# Patient Record
Sex: Female | Born: 1995 | Race: White | Hispanic: No | Marital: Married | Smoking: Never smoker
Health system: Southern US, Community
[De-identification: ages and names within clinical notes are randomized; demographics above are authoritative.]

## PROBLEM LIST (undated history)

## (undated) DIAGNOSIS — F410 Panic disorder [episodic paroxysmal anxiety] without agoraphobia: Secondary | ICD-10-CM

## (undated) DIAGNOSIS — N6001 Solitary cyst of right breast: Secondary | ICD-10-CM

## (undated) DIAGNOSIS — F419 Anxiety disorder, unspecified: Secondary | ICD-10-CM

## (undated) DIAGNOSIS — F32A Depression, unspecified: Secondary | ICD-10-CM

## (undated) HISTORY — DX: Anxiety disorder, unspecified: F41.9

## (undated) HISTORY — DX: Solitary cyst of right breast: N60.01

## (undated) HISTORY — DX: Depression, unspecified: F32.A

## (undated) HISTORY — DX: Panic disorder (episodic paroxysmal anxiety): F41.0

## (undated) HISTORY — PX: WISDOM TOOTH EXTRACTION: SHX21

---
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
## 2016-03-05 ENCOUNTER — Ambulatory Visit (INDEPENDENT_AMBULATORY_CARE_PROVIDER_SITE_OTHER): Payer: BLUE CROSS/BLUE SHIELD | Admitting: Nurse Practitioner

## 2016-03-05 ENCOUNTER — Encounter: Payer: Self-pay | Admitting: Nurse Practitioner

## 2016-03-05 VITALS — BP 124/80 | Ht 64.0 in | Wt 133.5 lb

## 2016-03-05 DIAGNOSIS — B3731 Acute candidiasis of vulva and vagina: Secondary | ICD-10-CM

## 2016-03-05 DIAGNOSIS — B373 Candidiasis of vulva and vagina: Secondary | ICD-10-CM

## 2016-03-05 MED ORDER — FLUCONAZOLE 150 MG PO TABS: ORAL_TABLET | ORAL | Status: DC

## 2016-03-05 NOTE — Patient Instructions (Addendum)
Acidophilus supplement

## 2016-03-06 ENCOUNTER — Encounter: Payer: Self-pay | Admitting: Nurse Practitioner

## 2016-03-06 NOTE — Progress Notes (Signed)
Subjective:  Presents for c/o vaginal yeast infection. Slight itching and burning in the vaginal area with white clumpy discharge. Was seen by another provider and prescribed diflucan; took first dose on 6/11. Was slightly better but symptoms came back. No pelvic exam today. Has started her cycle. Works as a Public relations account executivelifeguard. Began after wearing damp swim suit. Same sexual partner. No fever or pelvic pain. Third yeast infection over the past year; tends to have them every 4 months or so.    Objective:   BP 124/80 mmHg  Ht 5\' 4"  (1.626 m)  Wt 133 lb 8 oz (60.555 kg)  BMI 22.90 kg/m2 NAD. Alert, oriented. Lungs clear. Heart RRR.    Assessment: Vaginal candidiasis  Plan:  Meds ordered this encounter  Medications  . fluconazole (DIFLUCAN) 150 MG tablet    Sig: One po qd prn yeast infection; may repeat in 3-4 days if needed    Dispense:  2 tablet    Refill:  0    Order Specific Question:  Supervising Provider    Answer:  Merlyn AlbertLUKING, WILLIAM S [[id number]]   Discussed causes of yeast infections and preventive measures. Take next dose of Diflucan tomorrow as planned. Call back by Friday if persists.  Given another Rx in case it is needed over the summer.

## 2016-03-19 ENCOUNTER — Telehealth: Payer: Self-pay | Admitting: Nurse Practitioner

## 2016-03-19 NOTE — Telephone Encounter (Signed)
I would recommend repeating the Diflucan again in one week's time. Patient may use over-the-counter in addition to this such as Monistat-if ongoing troubles reconnect with Stacey Jonesarolyn

## 2016-03-19 NOTE — Telephone Encounter (Signed)
Patient was seen for a yeast infection on 03/05/2016, given Diflucan, and Eber JonesCarolyn told patient to call us back if it came back.  She is having the same symptoms again. She has two diflucan pills, has taken one today.  She said Eber JonesCarolyn had mentioned trying her on something else if it came back.  Please advise.

## 2016-03-19 NOTE — Telephone Encounter (Signed)
[facility] 03/19/16

## 2016-03-20 NOTE — Telephone Encounter (Signed)
[facility] 03/20/16 

## 2016-03-21 NOTE — Telephone Encounter (Signed)
Spoke with patient and informed her per Dr.Scott Luking- Dr.Scott would recommend repeating the Diflucan again in one week's time. Patient may use over-the-counter in addition to this such as Monistat-if ongoing troubles reconnect with Eber Jonesarolyn. Patient verbalized understanding.

## 2016-03-22 ENCOUNTER — Telehealth: Payer: Self-pay | Admitting: Family Medicine

## 2016-03-22 MED ORDER — FLUCONAZOLE 150 MG PO TABS: ORAL_TABLET | ORAL | Status: DC

## 2016-03-22 NOTE — Telephone Encounter (Signed)
Patient was told by Dr. Lorin PicketScott to call back today and have 2 diflucan called in for her yeast infection.    [facility]  Also, we did a prior authorization for patient's norethindrone-ethinyl estradiol 1/35 (ORTHO-NOVUM, NORTREL,CYCLAFEM) tablet in March 2017, but mom said that nothing had ever changed with the price and the pharmacy is still telling her we just have to call the insurance and tell them why she needs it.  She would like a nurse to call her back regarding this.

## 2016-03-22 NOTE — Telephone Encounter (Signed)
Mom says to please call Adelina MingsKelsey at [phone].

## 2016-03-22 NOTE — Telephone Encounter (Signed)
Patients mother came by and signed the DPR form giving us permission to talk with her mother, Victorino DikeJennifer.  She is requesting a nurse to call her back, because the pharmacy is still telling her that a PA needs to be done.

## 2016-03-22 NOTE — Telephone Encounter (Signed)
Reviewed patient's chart and saw that prescription was approved in March for 1 year. Also called pharmacy and pharmacist stated that medication is covered 100% and patient picked up prescription on June 15 and paid $0.00 Copay. Spoke with patients mother and patient's mother stated that she has paid a copay. Informed mom that we will call pharmacy and see if they can fax a form where medication is covered.

## 2016-03-22 NOTE — Telephone Encounter (Signed)
Left message for patient to return call 03/22/16

## 2016-03-22 NOTE — Telephone Encounter (Signed)
Unable to speak with patient's mother due to patient's mother not being on HIPPA

## 2016-03-22 NOTE — Telephone Encounter (Signed)
Spoke with patient and informed her per Dr.Scott Luking- refills on Diflucan were sent into pharmacy. Also informed patient that Birth control Norethindrone-ethinyl esradiol 1/35 was approved in March via patient's insurance. Patient verbalized understanding.

## 2016-03-25 NOTE — Telephone Encounter (Signed)
Spoke with Trey PaulaJeff at [facility]- approval is on file and good 12/15/15-11/2416. Insurance stated it is a Production designer, theatre/television/film9.88 dollar copay. Confirmed with pharmacist Tripp at [facility] that the med is going thru correctly and notified mother who verbalized understanding.

## 2016-03-25 NOTE — Telephone Encounter (Signed)
Left message to return call 

## 2016-05-17 ENCOUNTER — Telehealth: Payer: Self-pay | Admitting: Nurse Practitioner

## 2016-05-17 MED ORDER — FLUCONAZOLE 150 MG PO TABS: ORAL_TABLET | ORAL | 0 refills | Status: DC

## 2016-05-17 NOTE — Telephone Encounter (Signed)
Spoke with patient to discuss symptoms. Patient has c/o vaginal itching, and burning with white discharge. Per protocol Diflucan 150 MG with two tablets, take 1 tablet 3-4 days apart was sent into patient's requested pharmacy. Patient verbalized understanding.

## 2016-05-17 NOTE — Telephone Encounter (Signed)
Pt is needing two diflucan called in for a yeast infection.      [facility] [address] [place]

## 2016-08-09 ENCOUNTER — Telehealth: Payer: Self-pay | Admitting: Family Medicine

## 2016-08-09 MED ORDER — FLUCONAZOLE 150 MG PO TABS: ORAL_TABLET | ORAL | 0 refills | Status: DC

## 2016-08-09 NOTE — Telephone Encounter (Signed)
Pt is requesting diflucan x2 to be called in for a yeast inf.    [facility] [place] [address]

## 2016-08-09 NOTE — Telephone Encounter (Signed)
Spoke with patient to discuss symptoms and patient stated that she is experiencing white vaginal discharge with vaginal itching, and swelling. Per protocol Diflucan 150 mg was sent into pharmacy.

## 2016-08-09 NOTE — Telephone Encounter (Signed)
Tried to call no answer. (voicemail box full) 08/09/16

## 2016-08-13 ENCOUNTER — Telehealth: Payer: Self-pay | Admitting: Family Medicine

## 2016-08-13 ENCOUNTER — Other Ambulatory Visit: Payer: Self-pay | Admitting: Nurse Practitioner

## 2016-08-13 MED ORDER — TERCONAZOLE 0.4 % VA CREA: 1.0000 | TOPICAL_CREAM | Freq: Every day | VAGINAL | 0 refills | Status: DC

## 2016-08-13 NOTE — Telephone Encounter (Signed)
Pt called stating that she took the last diflucan yesterday and that there is still some itching that all the other symptoms have went away. Please advise.

## 2016-08-13 NOTE — Telephone Encounter (Signed)
Will send in a strong vaginal cream to use nightly for 7 days. Also start OTC acidophilus to help prevent yeast infection. Call back if persists.

## 2016-08-13 NOTE — Telephone Encounter (Signed)
Patient has already scheduled a recheck tomorrow with you concerning this issue

## 2016-08-14 ENCOUNTER — Ambulatory Visit (INDEPENDENT_AMBULATORY_CARE_PROVIDER_SITE_OTHER): Payer: BLUE CROSS/BLUE SHIELD | Admitting: Nurse Practitioner

## 2016-08-14 ENCOUNTER — Encounter: Payer: Self-pay | Admitting: Nurse Practitioner

## 2016-08-14 VITALS — BP 130/88 | Ht 64.0 in | Wt 141.8 lb

## 2016-08-14 DIAGNOSIS — B373 Candidiasis of vulva and vagina: Secondary | ICD-10-CM | POA: Diagnosis not present

## 2016-08-14 DIAGNOSIS — Z23 Encounter for immunization: Secondary | ICD-10-CM | POA: Diagnosis not present

## 2016-08-14 DIAGNOSIS — Z113 Encounter for screening for infections with a predominantly sexual mode of transmission: Secondary | ICD-10-CM

## 2016-08-14 DIAGNOSIS — B3731 Acute candidiasis of vulva and vagina: Secondary | ICD-10-CM

## 2016-08-14 NOTE — Telephone Encounter (Signed)
Office visit done 08/14/16

## 2016-08-16 ENCOUNTER — Encounter: Payer: Self-pay | Admitting: Nurse Practitioner

## 2016-08-16 NOTE — Progress Notes (Signed)
Subjective:  Presents for complaints of recurrent vaginal candidiasis. First diagnosed with this a few months ago. Has been with the same sexual partner for over a year. Diflucan usually works but the second dose this past week did not help. Worse with stress. No missed birth control pills. Regular menses, normal flow. White thick discharge. No pelvic pain fever or rash. No pain with intercourse. No odor or itching. Patient's phone message was answered before visit but she was not aware of this. Taking daily acidophilus which has cut back on the number and severity of her yeast infections.  Objective:   BP 130/88   Ht 5\' 4"  (1.626 m)   Wt 141 lb 12.8 oz (64.3 kg)   BMI 24.34 kg/m  NAD. Alert, oriented. Lungs clear. Heart regular rhythm. Abdomen soft nontender.  Assessment: Vaginal candidiasis - Plan: Chlamydia/Gonococcus/Trichomonas, NAA  Screen for STD (sexually transmitted disease) - Plan: Chlamydia/Gonococcus/Trichomonas, NAA  Need for influenza vaccination - Plan: Flu Vaccine QUAD 36+ mos IM  Encounter for immunization - Plan: Flu Vaccine QUAD 36+ mos IM  Plan: Start Terazol cream as directed. Discussed importance of stress reduction. Continue acidophilus as directed. Patient to call back if symptoms worsen or persist.

## 2016-08-18 LAB — CHLAMYDIA/GONOCOCCUS/TRICHOMONAS, NAA
CHLAMYDIA BY NAA: NEGATIVE
GONOCOCCUS BY NAA: NEGATIVE
TRICH VAG BY NAA: NEGATIVE

## 2016-10-11 ENCOUNTER — Ambulatory Visit (INDEPENDENT_AMBULATORY_CARE_PROVIDER_SITE_OTHER): Payer: BLUE CROSS/BLUE SHIELD | Admitting: Nurse Practitioner

## 2016-10-11 ENCOUNTER — Encounter: Payer: Self-pay | Admitting: Nurse Practitioner

## 2016-10-11 VITALS — BP 100/68 | Ht 64.0 in | Wt 139.0 lb

## 2016-10-11 DIAGNOSIS — Z3041 Encounter for surveillance of contraceptive pills: Secondary | ICD-10-CM | POA: Diagnosis not present

## 2016-10-11 MED ORDER — NORETHINDRONE-ETH ESTRADIOL 1-35 MG-MCG PO TABS: 1.0000 | ORAL_TABLET | Freq: Every day | ORAL | 11 refills | Status: DC

## 2016-10-11 NOTE — Progress Notes (Signed)
Subjective:  Presents for recheck on birth control. No BTB. No missed pills. Same sexual partner. Rare smoker. STD testing UTD.  Objective:   BP 100/68   Ht 5\' 4"  (1.626 m)   Wt 139 lb (63 kg)   BMI 23.86 kg/m  NAD. Alert, oriented. Lungs clear. Heart RRR.   Assessment:  Encounter for surveillance of contraceptive pills    Plan:  Meds ordered this encounter  Medications  . norethindrone-ethinyl estradiol 1/35 (ORTHO-NOVUM, NORTREL,CYCLAFEM) tablet    Sig: Take 1 tablet by mouth daily.    Dispense:  1 Package    Refill:  11    Order Specific Question:   Supervising Provider    Answer:   Merlyn AlbertLUKING, WILLIAM S [[id number]]   Continue current oc. Recommend preventive health physical next year after she turns 22.  Call back sooner if any problems.

## 2016-10-21 ENCOUNTER — Other Ambulatory Visit: Payer: Self-pay | Admitting: *Deleted

## 2016-10-21 ENCOUNTER — Telehealth: Payer: Self-pay | Admitting: Family Medicine

## 2016-10-21 MED ORDER — FLUCONAZOLE 150 MG PO TABS: ORAL_TABLET | ORAL | 0 refills | Status: DC

## 2016-10-21 NOTE — Telephone Encounter (Signed)
Pt has a yeast infection, [facility]  Please call pt when done

## 2016-10-21 NOTE — Telephone Encounter (Signed)
Vaginal itching and white discharge. Diflucan 150mg  #2 one 3 days apart per dr Lorin Picketscott. Med sent to pharm. Pt notified med sent and to follow up in office if symptoms do not clear up with diflucan.

## 2017-09-03 ENCOUNTER — Other Ambulatory Visit: Payer: Self-pay | Admitting: Nurse Practitioner

## 2017-09-17 ENCOUNTER — Telehealth: Payer: Self-pay | Admitting: Nurse Practitioner

## 2017-09-17 MED ORDER — NORETHINDRONE-ETH ESTRADIOL 1-35 MG-MCG PO TABS: 1.0000 | ORAL_TABLET | Freq: Every day | ORAL | 0 refills | Status: DC

## 2017-09-17 NOTE — Telephone Encounter (Signed)
Patient is aware 

## 2017-09-17 NOTE — Telephone Encounter (Signed)
Pt is needing a refill on  ALAYCEN 1/35 tablet   [facility]  Pt has an appt scheduled for 09/29/2017

## 2017-09-17 NOTE — Telephone Encounter (Signed)
Ok 30 d 

## 2017-09-29 ENCOUNTER — Encounter: Payer: Self-pay | Admitting: Nurse Practitioner

## 2017-09-29 ENCOUNTER — Ambulatory Visit: Payer: BLUE CROSS/BLUE SHIELD | Admitting: Nurse Practitioner

## 2017-09-29 VITALS — BP 122/78 | Ht 64.5 in | Wt 135.0 lb

## 2017-09-29 DIAGNOSIS — Z23 Encounter for immunization: Secondary | ICD-10-CM

## 2017-09-29 DIAGNOSIS — Z3041 Encounter for surveillance of contraceptive pills: Secondary | ICD-10-CM | POA: Diagnosis not present

## 2017-09-29 MED ORDER — NORETHINDRONE-ETH ESTRADIOL 1-35 MG-MCG PO TABS: 1.0000 | ORAL_TABLET | Freq: Every day | ORAL | 11 refills | Status: DC

## 2017-09-29 NOTE — Progress Notes (Signed)
Subjective: Presents for refill on her birth control pills.  No missed pills.  No breakthrough bleeding.  Denies any adverse effects.  Currently in college, very active with regular exercise.  Non-smoker.  Has been with the same sexual partner for the past several months.  According to the patient she had a complete STD testing done by the health department in [place] back in the summer.  Request flu vaccine today.  Objective:   BP 122/78   Ht 5' 4.5" (1.638 m)   Wt 135 lb (61.2 kg)   BMI 22.81 kg/m  NAD.  Alert, oriented.  Lungs clear.  Heart regular rate and rhythm.  Assessment:  Encounter for surveillance of contraceptive pills  Need for vaccination - Plan: Flu Vaccine QUAD 6+ mos PF IM (Fluarix Quad PF)    Plan:   Meds ordered this encounter  Medications  . norethindrone-ethinyl estradiol 1/35 (ALAYCEN 1/35) tablet    Sig: Take 1 tablet by mouth daily.    Dispense:  30 tablet    Refill:  11    Order Specific Question:   Supervising Provider    Answer:   Merlyn AlbertLUKING, WILLIAM S [[id number]]   Continue current pill as directed.  Again reviewed potential risk associated with OC use.  Discussed safe sex issues.  Strongly recommend physical with Pap smear this year while she is out of school on break.  Otherwise recheck in 1 year.

## 2017-10-27 ENCOUNTER — Telehealth: Payer: Self-pay | Admitting: Nurse Practitioner

## 2017-10-27 ENCOUNTER — Other Ambulatory Visit: Payer: Self-pay | Admitting: Nurse Practitioner

## 2017-10-27 MED ORDER — NORETHINDRONE-ETH ESTRADIOL 1-35 MG-MCG PO TABS: 1.0000 | ORAL_TABLET | Freq: Every day | ORAL | 11 refills | Status: DC

## 2017-10-27 NOTE — Telephone Encounter (Signed)
Please verify that we have not changed her actual hormones. The hormones should be the same but different generics can cause some issues. We are seeing more brands of generic birth control pills from around the world. Technically, they are supposed to be the same but women have had problems such as breakthrough bleeding. Each pharmacy will have different brands they order mainly depending on cost. The best thing to do is find out which one she tolerates the best and ask her pharmacy to stock that brand for her.

## 2017-10-27 NOTE — Telephone Encounter (Signed)
Pt called stating that she started a new birth control and is now bleeding and wants to know why. Pt was seen on 09/29/2017 and was prescribed norethindrone-ethinyl estradiol 1/35 (ALAYCEN 1/35) tablet

## 2017-10-27 NOTE — Telephone Encounter (Signed)
Pt called back wants the Name brand Alyacen called in with one year refill to the pharmacy.

## 2017-10-27 NOTE — Telephone Encounter (Signed)
Patient states she started this new birthcontrol on the Sunday after her period. She states she did not have any bleeding the first week after starting,but the second week after starting she started bleeding (a normal bleed for her period it is lite in nature).She has not missed any pills. She states in the past she was taking Alyacen that she gets in [place] and Nortrel that she gets here when home.Please advise.Is this normal?

## 2017-10-27 NOTE — Telephone Encounter (Signed)
I spoke with the pt she says she would like for you to send in Stacey Prince 35 mg /28 tablet to [facility] [address]She will call them and let them know they will need to keep in stock for her. She also wants to know if she needs to finish the one she is currently on or start on this new one? Please advise.

## 2017-10-27 NOTE — Telephone Encounter (Signed)
Done

## 2018-03-20 ENCOUNTER — Encounter: Payer: Self-pay | Admitting: Nurse Practitioner

## 2018-03-20 ENCOUNTER — Ambulatory Visit: Payer: BLUE CROSS/BLUE SHIELD | Admitting: Nurse Practitioner

## 2018-03-20 VITALS — BP 128/80 | Temp 98.5°F | Ht 64.5 in | Wt 131.0 lb

## 2018-03-20 DIAGNOSIS — J300 Vasomotor rhinitis: Secondary | ICD-10-CM | POA: Diagnosis not present

## 2018-03-20 MED ORDER — METHYLPREDNISOLONE ACETATE 40 MG/ML IJ SUSP
40.0000 mg | Freq: Once | INTRAMUSCULAR | Status: AC
  Administered 2018-03-20: 40 mg via INTRAMUSCULAR

## 2018-03-20 NOTE — Progress Notes (Signed)
Subjective: Presents for complaints of sinus pressure and pain in her ears especially the left side over the past week.  No fever.  Sore throat improved.  No headache.  Occasional nonproductive cough.  No wheezing.  Objective:   BP 128/80   Temp 98.5 F (36.9 C) (Oral)   Ht 5' 4.5" (1.638 m)   Wt 131 lb (59.4 kg)   BMI 22.14 kg/m  NAD.  Alert, oriented.  TMs retracted bilateral, more on the left, no erythema.  Pharynx minimally injected with cloudy PND noted.  Neck supple with mild soft anterior adenopathy.  Lungs clear.  Heart regular rate and rhythm.  Assessment:  Vasomotor rhinitis - Plan: methylPREDNISolone acetate (DEPO-MEDROL) injection 40 mg    Plan:   Meds ordered this encounter  Medications  . methylPREDNISolone acetate (DEPO-MEDROL) injection 40 mg   Continue OTC loratadine as directed.  Add Nasacort AQ to regimen as directed.  Call back next week if no improvement, sooner if worse.  No antibiotics indicated at this time.  Given dose of Depo-Medrol due to intense ear pain and pressure.

## 2018-03-20 NOTE — Patient Instructions (Signed)
OTC antihistamine Nasacort AQ as directed 

## 2018-10-22 DIAGNOSIS — Z3040 Encounter for surveillance of contraceptives, unspecified: Secondary | ICD-10-CM | POA: Diagnosis not present

## 2018-10-22 DIAGNOSIS — Z01411 Encounter for gynecological examination (general) (routine) with abnormal findings: Secondary | ICD-10-CM | POA: Diagnosis not present

## 2018-10-22 DIAGNOSIS — N63 Unspecified lump in unspecified breast: Secondary | ICD-10-CM | POA: Diagnosis not present

## 2018-10-22 DIAGNOSIS — Z01419 Encounter for gynecological examination (general) (routine) without abnormal findings: Secondary | ICD-10-CM | POA: Diagnosis not present

## 2018-10-22 DIAGNOSIS — Z6823 Body mass index (BMI) 23.0-23.9, adult: Secondary | ICD-10-CM | POA: Diagnosis not present

## 2018-10-26 ENCOUNTER — Other Ambulatory Visit: Payer: Self-pay | Admitting: Obstetrics and Gynecology

## 2018-10-26 DIAGNOSIS — N6310 Unspecified lump in the right breast, unspecified quadrant: Secondary | ICD-10-CM

## 2018-10-30 ENCOUNTER — Ambulatory Visit
Admission: RE | Admit: 2018-10-30 | Discharge: 2018-10-30 | Disposition: A | Discharge status: Home / Self Care | Payer: BLUE CROSS/BLUE SHIELD | Source: Ambulatory Visit | Attending: Obstetrics and Gynecology | Admitting: Obstetrics and Gynecology

## 2018-10-30 ENCOUNTER — Other Ambulatory Visit: Payer: Self-pay | Admitting: Obstetrics and Gynecology

## 2018-10-30 DIAGNOSIS — N6310 Unspecified lump in the right breast, unspecified quadrant: Secondary | ICD-10-CM

## 2018-10-30 DIAGNOSIS — N6311 Unspecified lump in the right breast, upper outer quadrant: Secondary | ICD-10-CM | POA: Diagnosis not present

## 2018-11-02 ENCOUNTER — Other Ambulatory Visit: Payer: Self-pay | Admitting: Obstetrics and Gynecology

## 2018-12-05 DIAGNOSIS — J019 Acute sinusitis, unspecified: Secondary | ICD-10-CM | POA: Diagnosis not present

## 2019-01-13 ENCOUNTER — Other Ambulatory Visit: Payer: Self-pay

## 2019-01-13 ENCOUNTER — Ambulatory Visit (INDEPENDENT_AMBULATORY_CARE_PROVIDER_SITE_OTHER): Payer: BLUE CROSS/BLUE SHIELD | Admitting: Family Medicine

## 2019-01-13 ENCOUNTER — Encounter: Payer: Self-pay | Admitting: Family Medicine

## 2019-01-13 DIAGNOSIS — H6593 Unspecified nonsuppurative otitis media, bilateral: Secondary | ICD-10-CM

## 2019-01-13 MED ORDER — DOXYCYCLINE HYCLATE 100 MG PO TABS: 100.0000 mg | ORAL_TABLET | Freq: Two times a day (BID) | ORAL | 0 refills | Status: DC

## 2019-01-13 NOTE — Progress Notes (Signed)
   Subjective:    Patient ID: Stacey Prince, female    DOB: 03-05-1996, 23 y.o.   MRN: [id number]  HPI Patient calls with bilateral ear pain since weekend. Patient having some allergy sx but nothing related to ear pain.  Virtual Visit via Video Note  I connected with Stacey Prince on 01/13/19 at 11:30 AM EDT by a video enabled telemedicine application and verified that I am speaking with the correct person using two identifiers.   I discussed the limitations of evaluation and management by telemedicine and the availability of in person appointments. The patient expressed understanding and agreed to proceed.  History of Present Illness:    Observations/Objective:   Assessment and Plan:   Follow Up Instructions:    I discussed the assessment and treatment plan with the patient. The patient was provided an opportunity to ask questions and all were answered. The patient agreed with the plan and demonstrated an understanding of the instructions.   The patient was advised to call back or seek an in-person evaluation if the symptoms worsen or if the condition fails to improve as anticipated.  I provided 15 minutes of non-face-to-face time during this encounter.  Patient notes congestion.  Some drainage.  Allergy symptoms for a number of weeks.  Now fullness in both ears.  Bordering on pain.  Aching at times.  With some diminished hearing.    Review of Systems  No headache, no major weight loss or weight gain, no chest pain no back pain abdominal pain no change in bowel habits complete ROS otherwise negative     Objective:   Physical Exam  Virtual visit      Assessment & Plan:  Impression probable bilateral otitis media following respiratory allergies.  Discussed.  Symptom care discussed.  Antibiotics prescribed.  Follow-up if persists warning signs discussed

## 2019-05-04 ENCOUNTER — Other Ambulatory Visit: Payer: BLUE CROSS/BLUE SHIELD

## 2019-05-05 ENCOUNTER — Other Ambulatory Visit: Payer: Self-pay

## 2019-05-05 ENCOUNTER — Encounter: Payer: Self-pay | Admitting: Family Medicine

## 2019-05-05 ENCOUNTER — Ambulatory Visit (INDEPENDENT_AMBULATORY_CARE_PROVIDER_SITE_OTHER): Payer: BC Managed Care – PPO | Admitting: Family Medicine

## 2019-05-05 VITALS — BP 110/72 | Temp 97.7°F | Ht 64.5 in | Wt 143.8 lb

## 2019-05-05 DIAGNOSIS — Z0000 Encounter for general adult medical examination without abnormal findings: Secondary | ICD-10-CM

## 2019-05-05 NOTE — Progress Notes (Signed)
Shot record printed off from [place]. Does patient need this mailed to her? Please advise. Thank you

## 2019-05-05 NOTE — Progress Notes (Signed)
   Subjective:    Patient ID: Stacey Prince, female    DOB: [birth year], 23 y.o.   MRN: [id number]  HPI  The patient comes in today for a wellness visit. Patient denies being depressed.  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  A review of medications was also completed.  Any needed refills; none  Eating habits: eats good  Falls/  MVA accidents in past few months: none  Regular exercise: yes-active  Specialist pt sees on regular basis: none  Preventative health issues were discussed.   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  HENT: Negative for congestion and rhinorrhea.   Respiratory: Negative for cough and shortness of breath.   Cardiovascular: Negative for chest pain and leg swelling.  Gastrointestinal: Negative for abdominal pain and diarrhea.  Endocrine: Negative for polydipsia and polyphagia.  Skin: Negative for color change.  Neurological: Negative for dizziness and weakness.  Psychiatric/Behavioral: Negative for behavioral problems and confusion.        Objective:   Physical Exam Vitals signs reviewed.  Constitutional:      General: She is not in acute distress. HENT:     Head: Normocephalic and atraumatic.  Eyes:     General:        Right eye: No discharge.        Left eye: No discharge.  Neck:     Trachea: No tracheal deviation.  Cardiovascular:     Rate and Rhythm: Normal rate and regular rhythm.     Heart sounds: Normal heart sounds. No murmur.  Pulmonary:     Effort: Pulmonary effort is normal. No  respiratory distress.     Breath sounds: Normal breath sounds.  Lymphadenopathy:     Cervical: No cervical adenopathy.  Skin:    General: Skin is warm and dry.  Neurological:     Mental Status: She is alert.     Coordination: Coordination normal.  Psychiatric:        Behavior: Behavior normal.    Lab work not indicated  Patient gets female health checkups through her gynecologist     Assessment & Plan:  Adult wellness-complete.wellness physical was conducted today. Importance of diet and exercise were discussed in detail.  In addition to this a discussion regarding safety was also covered. We also reviewed over immunizations and gave recommendations regarding current immunization needed for age.  In addition to this additional areas were also touched on including: Preventative health exams needed:  Colonoscopy no colonoscopies necessary at  Patient was advised yearly wellness exam She will bring [place] her form so we can fill it out.  Patient should be able to do fine in her applied for a job

## 2019-05-06 NOTE — Progress Notes (Signed)
Mainly it was for me-I wanted to review it so I can make that determination if she needs more shots thank you

## 2019-05-06 NOTE — Progress Notes (Signed)
Shot record is in your office for review. Please advise. Thank you

## 2019-05-07 ENCOUNTER — Other Ambulatory Visit: Payer: Self-pay | Admitting: Obstetrics and Gynecology

## 2019-05-07 ENCOUNTER — Ambulatory Visit
Admission: RE | Admit: 2019-05-07 | Discharge: 2019-05-07 | Disposition: A | Discharge status: Home / Self Care | Payer: BLUE CROSS/BLUE SHIELD | Source: Ambulatory Visit | Attending: Obstetrics and Gynecology | Admitting: Obstetrics and Gynecology

## 2019-05-07 ENCOUNTER — Other Ambulatory Visit: Payer: Self-pay

## 2019-05-07 DIAGNOSIS — N6310 Unspecified lump in the right breast, unspecified quadrant: Secondary | ICD-10-CM

## 2019-05-20 ENCOUNTER — Telehealth: Payer: Self-pay | Admitting: Family Medicine

## 2019-05-20 NOTE — Telephone Encounter (Signed)
Needs form complete for job, form in [place]  Had PE 05/05/2019  Please advise & call pt

## 2019-05-23 NOTE — Telephone Encounter (Signed)
I finish out the form It appears that the patient needs TB test Also can we have her shot records for review potentially printing these off? Please touch base with the patient if she needs to do the TB test I would highly recommend getting down quickly Form at nurses station thank you

## 2019-05-24 NOTE — Telephone Encounter (Signed)
Shot record printed off. Per Dr.Scott; please give patient handout on HPV vaccine and let her know that this is a vaccine that he highly recommends getting. Pt voicemail is full; unable to leave a message. Contacted home number and left message with dad to have Evalyne give [place] a call to set her up for a TB test.

## 2019-05-25 ENCOUNTER — Other Ambulatory Visit (INDEPENDENT_AMBULATORY_CARE_PROVIDER_SITE_OTHER): Payer: BC Managed Care – PPO

## 2019-05-25 ENCOUNTER — Other Ambulatory Visit: Payer: Self-pay

## 2019-05-25 DIAGNOSIS — Z111 Encounter for screening for respiratory tuberculosis: Secondary | ICD-10-CM | POA: Diagnosis not present

## 2019-05-25 NOTE — Telephone Encounter (Signed)
Pt came in today for TB test.

## 2019-05-27 LAB — TB SKIN TEST
Induration: 0 mm
TB Skin Test: NEGATIVE

## 2019-07-09 ENCOUNTER — Ambulatory Visit (INDEPENDENT_AMBULATORY_CARE_PROVIDER_SITE_OTHER): Payer: BC Managed Care – PPO | Admitting: Nurse Practitioner

## 2019-07-09 ENCOUNTER — Encounter: Payer: Self-pay | Admitting: Nurse Practitioner

## 2019-07-09 DIAGNOSIS — F418 Other specified anxiety disorders: Secondary | ICD-10-CM | POA: Diagnosis not present

## 2019-07-09 MED ORDER — ESCITALOPRAM OXALATE 10 MG PO TABS: 10.0000 mg | ORAL_TABLET | Freq: Every day | ORAL | 2 refills | Status: DC

## 2019-07-09 NOTE — Progress Notes (Signed)
  VIDEO VISIT Subjective:    Patient ID: Stacey Prince, female    DOB: 07/16/1996, 23 y.o.   MRN: [id number]  HPI Pt states that for the past 2 months, she has not felt like herself. Pt states that she becomes angry easy and does not mean to.   Virtual Visit via Video Note  I connected with Stacey Prince on 07/09/19 at 10:20 AM EDT by a video enabled telemedicine application and verified that I am speaking with the correct person using two identifiers.  Location: Patient: home Provider: office   I discussed the limitations of evaluation and management by telemedicine and the availability of in person appointments. The patient expressed understanding and agreed to proceed.  History of Present Illness: Presents for c/o feeling irritated, angry and sad all the time. Feels out of control, not like herself. Not happy at this point. Began about 2 months ago. Attributes to several life stressors including a recent marriage that is "not going well". Also has no relationship with her biological father. Had similar issues a few years ago during a bad break up. Eating fine but decreased appetite. Occasional trouble sleeping. Melatonin usually works to help her go to sleep. Has a great support system. Defers need for counseling at this time. [place]: her father, aunt and cousin depression. Her cousin takes Lexapro so patient would like to try this. On contraceptives. Denies pregnancy. Denies suicidal thoughts or ideation.    Observations/Objective: Format  Patient present at home Provider present at office Consent for interaction obtained Coronavirus outbreak made virtual visit necessary  Alert, oriented. Making good eye contact. Thoughts logical, coherent and relevant. Cheerful affect.   Assessment and Plan: Problem List Items Addressed This Visit      Other   Anxiety with depression - Primary     Meds ordered this encounter  Medications  . escitalopram (LEXAPRO) 10 MG tablet     Sig: Take 1 tablet (10 mg total) by mouth daily.    Dispense:  30 tablet    Refill:  2    Order Specific Question:   Supervising Provider    Answer:   Sallee Lange A [[id number]]      Follow Up Instructions: Reviewed potential adverse effects of Lexapro. DC med and call if any issues. Defers counseling.  Return in about 1 month (around 08/09/2019) for virtual or in person.    I discussed the assessment and treatment plan with the patient. The patient was provided an opportunity to ask questions and all were answered. The patient agreed with the plan and demonstrated an understanding of the instructions.   The patient was advised to call back or seek an in-person evaluation if the symptoms worsen or if the condition fails to improve as anticipated.  I provided 15 minutes of non-face-to-face time during this encounter.       Review of Systems     Objective:   Physical Exam        Assessment & Plan:

## 2019-08-13 ENCOUNTER — Other Ambulatory Visit: Payer: Self-pay

## 2019-08-13 ENCOUNTER — Ambulatory Visit (INDEPENDENT_AMBULATORY_CARE_PROVIDER_SITE_OTHER): Payer: BC Managed Care – PPO | Admitting: Nurse Practitioner

## 2019-08-13 DIAGNOSIS — F418 Other specified anxiety disorders: Secondary | ICD-10-CM | POA: Diagnosis not present

## 2019-08-13 MED ORDER — ESCITALOPRAM OXALATE 10 MG PO TABS: 10.0000 mg | ORAL_TABLET | Freq: Every day | ORAL | 0 refills | Status: DC

## 2019-08-13 NOTE — Progress Notes (Signed)
  Phone visit Subjective:    Patient ID: Stacey Prince, female    DOB: Feb 10, 1996, 23 y.o.   MRN: [id number]  HPI Pt here for follow up. Pt states she is doing well. Pt is taking 10 mg Lexapro daily. Pt states she just refilled her Lexapro.  Virtual Visit via Telephone Note  I connected with Stacey Prince on 08/13/19 at  2:00 PM EST by telephone and verified that I am speaking with the correct person using two identifiers.  Location: Patient: home Provider: office   I discussed the limitations, risks, security and privacy concerns of performing an evaluation and management service by telephone and the availability of in person appointments. I also discussed with the patient that there may be a patient responsible charge related to this service. The patient expressed understanding and agreed to proceed.   History of Present Illness: Presents for recheck on her anxiety and depression.  States she is feeling much better on Lexapro.  States it "mellows her out".  Has been much more social and more interactions.  Sleeping well.  Has been working out the gym about every day which has helped her stress level.  Denies suicidal or homicidal thoughts or ideation.    Observations/Objective: Today's visit was via telephone Physical exam was not possible for this visit Alert, oriented.  Tearful, affect.  Thoughts logical coherent and relevant.  Assessment and Plan: Problem List Items Addressed This Visit      Other   Anxiety with depression - Primary   Relevant Medications   escitalopram (LEXAPRO) 10 MG tablet     Meds ordered this encounter  Medications  . escitalopram (LEXAPRO) 10 MG tablet    Sig: Take 1 tablet (10 mg total) by mouth daily.    Dispense:  90 tablet    Refill:  0    Order Specific Question:   Supervising Provider    Answer:   Sallee Lange A [[id number]]     Follow Up Instructions: Continue current dose of Lexapro which is working well for the patient.   Encouraged her to continue stress reduction such as regular exercise. Return in about 3 months (around 11/13/2019) for Recheck.    I discussed the assessment and treatment plan with the patient. The patient was provided an opportunity to ask questions and all were answered. The patient agreed with the plan and demonstrated an understanding of the instructions.   The patient was advised to call back or seek an in-person evaluation if the symptoms worsen or if the condition fails to improve as anticipated.  I provided 15 minutes of non-face-to-face time during this encounter.   Vicente Males, LPN    Review of Systems     Objective:   Physical Exam        Assessment & Plan:

## 2019-08-14 ENCOUNTER — Encounter: Payer: Self-pay | Admitting: Nurse Practitioner

## 2019-09-22 ENCOUNTER — Telehealth: Payer: Self-pay | Admitting: Family Medicine

## 2019-09-22 ENCOUNTER — Other Ambulatory Visit: Payer: Self-pay | Admitting: Nurse Practitioner

## 2019-09-22 MED ORDER — FLUCONAZOLE 150 MG PO TABS: ORAL_TABLET | ORAL | 0 refills | Status: DC

## 2019-09-22 NOTE — Telephone Encounter (Signed)
Patient is requesting diflucan pills for yeast infection called into [facility]

## 2019-09-22 NOTE — Telephone Encounter (Signed)
Done

## 2019-09-30 ENCOUNTER — Other Ambulatory Visit: Payer: Self-pay

## 2019-09-30 ENCOUNTER — Ambulatory Visit
Admission: EM | Admit: 2019-09-30 | Discharge: 2019-09-30 | Disposition: A | Discharge status: Home / Self Care | Payer: BC Managed Care – PPO | Attending: Emergency Medicine | Admitting: Emergency Medicine

## 2019-09-30 DIAGNOSIS — Z20822 Contact with and (suspected) exposure to covid-19: Secondary | ICD-10-CM | POA: Diagnosis not present

## 2019-09-30 MED ORDER — FLUTICASONE PROPIONATE 50 MCG/ACT NA SUSP: 1.0000 | Freq: Every day | NASAL | 0 refills | Status: DC

## 2019-09-30 NOTE — ED Provider Notes (Signed)
[facility]    CSN: [id number] Arrival date & time: 09/30/19  1252      History   Chief Complaint Chief Complaint  Patient presents with  . loss of smell/taste    HPI Stacey Prince is a 24 y.o. female.   Stacey Prince 24 years old female presented to the urgent care with a complaint of Covid exposure last week.  Report congestion, loss of taste and smell for the past 3 days.  Denies sick exposure to COVID, flu or strep.  Denies recent travel.  Denies aggravating or alleviating symptoms.  Denies previous COVID infection.   Denies fever, chills, fatigue, , rhinorrhea, sore throat, cough, SOB, wheezing, chest pain, nausea, vomiting, changes in bowel or bladder habits.     The history is provided by the patient and the spouse. No language interpreter was used.    History reviewed. No pertinent past medical history.  Patient Active Problem List   Diagnosis Date Noted  . Anxiety with depression 07/09/2019  . FINGER SPRAIN 11/28/2009    History reviewed. No pertinent surgical history.  OB History   No obstetric history on file.      Home Medications    Prior to Admission medications   Medication Sig Start Date End Date Taking? Authorizing Provider  escitalopram (LEXAPRO) 10 MG tablet Take 1 tablet (10 mg total) by mouth daily. 08/13/19 08/12/20  Nilda Simmer, NP  fluconazole (DIFLUCAN) 150 MG tablet One po qd prn yeast infection; may repeat in 3-4 days if needed 09/22/19   Nilda Simmer, NP  norethindrone-ethinyl estradiol 1/35 (ALAYCEN 1/35) tablet Take 1 tablet by mouth daily. 10/27/17   Nilda Simmer, NP    Family History Family History  Problem Relation Age of Onset  . Healthy Mother   . Healthy Father     Social History Social History   Tobacco Use  . Smoking status: Never Smoker  . Smokeless tobacco: Never Used  Substance Use Topics  . Alcohol use: Not on file  . Drug use: Not on file     Allergies    Amoxicillin   Review of Systems Review of Systems  Constitutional: Negative.   HENT: Positive for congestion.   Respiratory: Negative.   Cardiovascular: Negative.   Gastrointestinal: Negative.   Neurological: Negative.        Loss of taste and smell  ROS: All other are negative  Physical Exam Triage Vital Signs ED Triage Vitals  Enc Vitals Group     BP      Pulse      Resp      Temp      Temp src      SpO2      Weight      Height      Head Circumference      Peak Flow      Pain Score      Pain Loc      Pain Edu?      Excl. in [place]?    No data found.  Updated Vital Signs BP 134/88 (BP Location: Right Arm)   Pulse (!) 112 Comment: pt states she is nervous  Temp 99.1 F (37.3 C) (Oral)   Resp 16   SpO2 97%   Visual Acuity Right Eye Distance:   Left Eye Distance:   Bilateral Distance:    Right Eye Near:   Left Eye Near:    Bilateral Near:     Physical Exam Vitals  and nursing note reviewed.  Constitutional:      General: She is not in acute distress.    Appearance: Normal appearance. She is normal weight. She is not ill-appearing or toxic-appearing.  HENT:     Head: Normocephalic.     Right Ear: Tympanic membrane, ear canal and external ear normal. There is no impacted cerumen.     Left Ear: Tympanic membrane, ear canal and external ear normal. There is no impacted cerumen.     Nose: Nose normal. No congestion.     Mouth/Throat:     Mouth: Mucous membranes are moist.     Pharynx: No oropharyngeal exudate or posterior oropharyngeal erythema.  Cardiovascular:     Rate and Rhythm: Normal rate and regular rhythm.     Pulses: Normal pulses.     Heart sounds: Normal heart sounds. No murmur.  Pulmonary:     Effort: Pulmonary effort is normal. No respiratory distress.     Breath sounds: No wheezing or rhonchi.  Chest:     Chest wall: No tenderness.  Abdominal:     General: Abdomen is flat. Bowel sounds are normal. There is no distension.     Palpations:  There is no mass.  Skin:    Capillary Refill: Capillary refill takes less than 2 seconds.  Neurological:     Mental Status: She is alert and oriented to person, place, and time.      UC Treatments / Results  Labs (all labs ordered are listed, but only abnormal results are displayed) Labs Reviewed  NOVEL CORONAVIRUS, NAA    EKG   Radiology No results found.  Procedures Procedures (including critical care time)  Medications Ordered in UC Medications - No data to display  Initial Impression / Assessment and Plan / UC Course  I have reviewed the triage vital signs and the nursing notes.  Pertinent labs & imaging results that were available during my care of the patient were reviewed by me and considered in my medical decision making (see chart for details).    COVID-19 test was ordered. Patient stable for discharge.  Benign physical exam.  Advised patient to quarantine until COVID-19 test results become available.  To go to ED for worsening symptoms. Final Clinical Impressions(s) / UC Diagnoses   Final diagnoses:  Suspected COVID-19 virus infection     Discharge Instructions     COVID testing ordered.  It will take between 5-7 days for test results.  Someone will contact you regarding abnormal results.    In the meantime: You should remain isolated in your home for 10 days from symptom onset AND greater than 72 hours after symptoms resolution (absence of fever without the use of fever-reducing medication and improvement in respiratory symptoms), whichever is longer Get plenty of rest and push fluids Tessalon Perles prescribed for cough Zyrtec-D prescribed for nasal congestion, runny nose, and/or sore throat Flonase prescribed for nasal congestion and runny nose Use medications daily for symptom relief Use OTC medications like ibuprofen or tylenol as needed fever or pain Call or go to the ED if you have any new or worsening symptoms such as fever, worsening cough,  shortness of breath, chest tightness, chest pain, turning blue, changes in mental status, etc...     ED Prescriptions    None     PDMP not reviewed this encounter.   Durward Parcel, FNP 09/30/19 1318

## 2019-09-30 NOTE — ED Triage Notes (Signed)
Pt presents to UC w/ c/o loss of taste and smell, nasal congestion x2-3 days. Pt states she has had positive covid exposure within the last week.

## 2019-09-30 NOTE — Discharge Instructions (Addendum)
COVID testing ordered.  It will take between 2-7 days for test results.  Someone will contact you regarding abnormal results.    In the meantime: You should remain isolated in your home for 10 days from symptom onset AND greater than 72 hours after symptoms resolution (absence of fever without the use of fever-reducing medication and improvement in respiratory symptoms), whichever is longer Get plenty of rest and push fluids Flonase prescribed for nasal congestion and runny nose Use medications daily for symptom relief Use OTC medications like ibuprofen or tylenol as needed fever or pain Call or go to the ED if you have any new or worsening symptoms such as fever, worsening cough, shortness of breath, chest tightness, chest pain, turning blue, changes in mental status, etc...  

## 2019-10-02 LAB — NOVEL CORONAVIRUS, NAA: SARS-CoV-2, NAA: DETECTED — AB

## 2019-10-04 ENCOUNTER — Telehealth (HOSPITAL_COMMUNITY): Payer: Self-pay | Admitting: Emergency Medicine

## 2019-10-04 NOTE — Telephone Encounter (Signed)

## 2019-10-24 ENCOUNTER — Encounter: Payer: Self-pay | Admitting: Nurse Practitioner

## 2019-10-25 ENCOUNTER — Other Ambulatory Visit: Payer: Self-pay | Admitting: *Deleted

## 2019-10-25 MED ORDER — ESCITALOPRAM OXALATE 20 MG PO TABS: 20.0000 mg | ORAL_TABLET | Freq: Every day | ORAL | 0 refills | Status: DC

## 2019-10-25 NOTE — Telephone Encounter (Signed)
Called pt  to let her know carolyn is out of the office til Friday and she states she thinks lexapro should be increased. She has been crying a lot and getting angry. No thoughts of hurting her self.

## 2019-10-25 NOTE — Telephone Encounter (Signed)
Pt would like to increase lexapro from 10 - 20mg  and have appt with carolyn on Friday. I transferred her up to schedule that on Friday and I sent in one refill on lexapro 20mg  to [facility].

## 2019-10-25 NOTE — Telephone Encounter (Signed)
Nurses The patient has options Option #1-we can schedule an appointment with Stacey Prince for Friday and she can discuss these issues then And keep her current dose of the medicine as is  Option #2-we can do a virtual visit with her earlier than Friday with myself  Option #3-we can increase the dose of her medicine from 10 mg to 20 mg and have her do a follow-up appointment with Stacey Prince on Friday to discuss further  Please see what the patient would like to do then we can move forward thank you

## 2019-10-29 ENCOUNTER — Ambulatory Visit (INDEPENDENT_AMBULATORY_CARE_PROVIDER_SITE_OTHER): Payer: BC Managed Care – PPO | Admitting: Nurse Practitioner

## 2019-10-29 ENCOUNTER — Other Ambulatory Visit: Payer: Self-pay

## 2019-10-29 ENCOUNTER — Encounter: Payer: Self-pay | Admitting: Nurse Practitioner

## 2019-10-29 DIAGNOSIS — F418 Other specified anxiety disorders: Secondary | ICD-10-CM | POA: Diagnosis not present

## 2019-10-29 MED ORDER — ESCITALOPRAM OXALATE 20 MG PO TABS: 20.0000 mg | ORAL_TABLET | Freq: Every day | ORAL | 1 refills | Status: DC

## 2019-10-29 NOTE — Progress Notes (Signed)
  PHONE VISIT Subjective:    Patient ID: Stacey Prince, female    DOB: [birth year], 24 y.o.   MRN: [id number]  HPI depression. Takes lexapro and states she is doing well. No concerns.  phq9  Virtual Visit via Telephone Note  I connected with Stacey Prince on 10/29/19 at  3:20 PM EST by telephone and verified that I am speaking with the correct person using two identifiers.  Location: Patient: home Provider: office   I discussed the limitations, risks, security and privacy concerns of performing an evaluation and management service by telephone and the availability of in person appointments. I also discussed with the patient that there may be a patient responsible charge related to this service. The patient expressed understanding and agreed to proceed.   History of Present Illness: Depression screen [facility] 2/9 10/29/2019 07/09/2019  Decreased Interest 0 1  Down, Depressed, Hopeless 2 1  PHQ - 2 Score 2 2  Altered sleeping 2 1  Tired, decreased energy 1 1  Change in appetite 0 0  Feeling bad or failure about yourself  0 1  Trouble concentrating 0 0  Moving slowly or fidgety/restless 0 0  Suicidal thoughts 0 0  PHQ-9 Score 5 5  Difficult doing work/chores Not difficult at all Somewhat difficult   Doing much better on Lexapro 20 mg qd. Started 4 days ago and can see a difference. Denies any side effects. Gets regular physicals with her GYN.     Observations/Objective: Today's visit was via telephone Physical exam was not possible for this visit Alert, oriented. Thoughts logical, coherent and relevant  Assessment and Plan:   Follow Up Instructions:    I discussed the assessment and treatment plan with the patient. The patient was provided an opportunity to ask questions and all were answered. The patient agreed with the plan and demonstrated an understanding of the instructions.   Problem List Items Addressed This Visit      Other   Anxiety with depression -  Primary   Relevant Medications   escitalopram (LEXAPRO) 20 MG tablet     Meds ordered this encounter  Medications  . escitalopram (LEXAPRO) 20 MG tablet    Sig: Take 1 tablet (20 mg total) by mouth daily.    Dispense:  30 tablet    Refill:  1    Order Specific Question:   Supervising Provider    Answer:   Lilyan Punt A [[id number]]   Continue current dose of Lexapro as directed.  Recheck in 3 months, call back sooner if any problems. The patient was advised to call back or seek an in-person evaluation if the symptoms worsen or if the condition fails to improve as anticipated.  I provided 15 minutes of non-face-to-face time during this encounter.        Review of Systems     Objective:   Physical Exam        Assessment & Plan:

## 2019-10-30 ENCOUNTER — Encounter: Payer: Self-pay | Admitting: Nurse Practitioner

## 2019-11-08 ENCOUNTER — Other Ambulatory Visit: Payer: BC Managed Care – PPO

## 2019-11-26 DIAGNOSIS — Z113 Encounter for screening for infections with a predominantly sexual mode of transmission: Secondary | ICD-10-CM | POA: Diagnosis not present

## 2019-11-26 DIAGNOSIS — Z3040 Encounter for surveillance of contraceptives, unspecified: Secondary | ICD-10-CM | POA: Diagnosis not present

## 2019-11-26 DIAGNOSIS — Z01419 Encounter for gynecological examination (general) (routine) without abnormal findings: Secondary | ICD-10-CM | POA: Diagnosis not present

## 2019-11-26 DIAGNOSIS — Z6826 Body mass index (BMI) 26.0-26.9, adult: Secondary | ICD-10-CM | POA: Diagnosis not present

## 2019-12-20 ENCOUNTER — Telehealth: Payer: Self-pay | Admitting: Family Medicine

## 2019-12-20 NOTE — Telephone Encounter (Signed)
Pt is now taking 20 mg Lexapro. Pt can tell something is different and states that she feels like the medication is not working as well as it was. Pt states that this has happened before and was told by Eber Jones to notify us if she felt different. Please advise. Thank you

## 2019-12-20 NOTE — Telephone Encounter (Signed)
Patient is requesting dosage increase on lexapro 20 mg called into [facility] last seen 10/29/2019

## 2019-12-22 NOTE — Telephone Encounter (Signed)
I agree that would be a good idea (Note-sertraline would have taken the place of Lexapro not added to the Lexapro thanks)

## 2019-12-22 NOTE — Telephone Encounter (Signed)
Tanya Please have discussion with the patient. If she finds herself feeling severely depressed or suicidal we need to July office visit ASAP Please make sure patient is not suicidal and please document accordingly If it is more that she just feels the medicines not doing as good as it used to and she would like to try something different An option would be sertraline 100 mg 1 daily this is in the same family but a stronger dose If we did start this medicine we would want her to do a follow-up either with myself or [place] Citizen in 3 to 4 weeks either in person or virtual  Please do the above then let me know

## 2019-12-22 NOTE — Telephone Encounter (Signed)
Spoke with patient. Pt states she is not feeling depressed or suicidal;she feels more tense. Pt uneasy about trying Sertraline 100 mg since at the moment she is currently taking Lexapro 20 mg. Pt would like to have a visit with provider to discuss medications. Pt transferred up front to set up appt.

## 2019-12-23 ENCOUNTER — Encounter: Payer: Self-pay | Admitting: Nurse Practitioner

## 2019-12-23 ENCOUNTER — Other Ambulatory Visit: Payer: Self-pay | Admitting: Nurse Practitioner

## 2019-12-24 ENCOUNTER — Other Ambulatory Visit: Payer: Self-pay | Admitting: *Deleted

## 2019-12-24 MED ORDER — SERTRALINE HCL 50 MG PO TABS: ORAL_TABLET | ORAL | 0 refills | Status: DC

## 2019-12-31 ENCOUNTER — Ambulatory Visit (INDEPENDENT_AMBULATORY_CARE_PROVIDER_SITE_OTHER): Payer: BC Managed Care – PPO | Admitting: Nurse Practitioner

## 2019-12-31 ENCOUNTER — Other Ambulatory Visit: Payer: Self-pay

## 2019-12-31 DIAGNOSIS — F418 Other specified anxiety disorders: Secondary | ICD-10-CM | POA: Diagnosis not present

## 2019-12-31 MED ORDER — FLUOXETINE HCL 20 MG PO CAPS: 20.0000 mg | ORAL_CAPSULE | Freq: Every day | ORAL | 2 refills | Status: DC

## 2019-12-31 NOTE — Progress Notes (Signed)
  Phone visit Subjective:    Patient ID: Stacey Prince, female    DOB: 01/28/1996, 24 y.o.   MRN: [id number]  HPI Patient calls today to discuss her anxiety and depression after starting Zoloft.  Patient states she does not like the Zoloft so far, she has felt tired and "zombie like". Patient stated she felt much better taking the Lexapro.  Virtual Visit via Video Note  I connected with Violeta Gelinas on 12/31/19 at  2:00 PM EDT by a video enabled telemedicine application and verified that I am speaking with the correct person using two identifiers.  Location: Patient: home Provider: office   I discussed the limitations of evaluation and management by telemedicine and the availability of in person appointments. The patient expressed understanding and agreed to proceed.  History of Present Illness: Presents by phone to discuss her recent prescription of Zoloft.  States that it is helping her anxiety and depression but even at low dose has noticed a significant flat affect.  Denies any suicidal or homicidal thoughts or ideation.  Would like to discuss options as far as medications.  States she did feel better on the Lexapro but it did not seem to be high enough dose.   Observations/Objective: Today's visit was via telephone Physical exam was not possible for this visit Alert, oriented.  Thoughts logical coherent and relevant.  Calm affect.  Assessment and Plan: Problem List Items Addressed This Visit      Other   Anxiety with depression - Primary   Relevant Medications   FLUoxetine (PROZAC) 20 MG capsule       Follow Up Instructions: Discussed options with patient regarding medications.  Stop Zoloft and switch to Prozac as directed.  Again patient is to DC medication and contact office if any severe adverse reaction.  If Prozac is not tolerated or does not work, consider switching back to Lexapro and titrating to 30 mg.  Also plan to slowly titrate Prozac dosing  as needed or tolerated. Return in about 1 month (around 01/30/2020).    I discussed the assessment and treatment plan with the patient. The patient was provided an opportunity to ask questions and all were answered. The patient agreed with the plan and demonstrated an understanding of the instructions.   The patient was advised to call back or seek an in-person evaluation if the symptoms worsen or if the condition fails to improve as anticipated.  I provided 15 minutes of non-face-to-face time during this encounter.    Review of Systems     Objective:   Physical Exam        Assessment & Plan:

## 2020-01-01 ENCOUNTER — Encounter: Payer: Self-pay | Admitting: Nurse Practitioner

## 2020-01-07 ENCOUNTER — Encounter: Payer: Self-pay | Admitting: Nurse Practitioner

## 2020-01-08 ENCOUNTER — Other Ambulatory Visit: Payer: Self-pay | Admitting: Nurse Practitioner

## 2020-01-08 MED ORDER — FLUOXETINE HCL 40 MG PO CAPS: 40.0000 mg | ORAL_CAPSULE | Freq: Every day | ORAL | 0 refills | Status: DC

## 2020-01-25 ENCOUNTER — Encounter: Payer: Self-pay | Admitting: Nurse Practitioner

## 2020-01-28 ENCOUNTER — Other Ambulatory Visit: Payer: Self-pay | Admitting: Nurse Practitioner

## 2020-01-28 ENCOUNTER — Encounter: Payer: Self-pay | Admitting: Nurse Practitioner

## 2020-01-28 MED ORDER — FLUOXETINE HCL 20 MG PO TABS: ORAL_TABLET | ORAL | 0 refills | Status: DC

## 2020-02-19 ENCOUNTER — Encounter: Payer: Self-pay | Admitting: Nurse Practitioner

## 2020-02-20 ENCOUNTER — Encounter: Payer: Self-pay | Admitting: Nurse Practitioner

## 2020-02-23 ENCOUNTER — Telehealth: Payer: Self-pay | Admitting: *Deleted

## 2020-02-23 ENCOUNTER — Telehealth (INDEPENDENT_AMBULATORY_CARE_PROVIDER_SITE_OTHER): Payer: BC Managed Care – PPO | Admitting: Nurse Practitioner

## 2020-02-23 ENCOUNTER — Encounter: Payer: Self-pay | Admitting: Nurse Practitioner

## 2020-02-23 ENCOUNTER — Other Ambulatory Visit: Payer: Self-pay

## 2020-02-23 DIAGNOSIS — F418 Other specified anxiety disorders: Secondary | ICD-10-CM | POA: Diagnosis not present

## 2020-02-23 MED ORDER — CLONAZEPAM 0.5 MG PO TABS
ORAL_TABLET | ORAL | 0 refills | Status: DC
Start: 2020-02-23 — End: 2020-07-10

## 2020-02-23 MED ORDER — FLUOXETINE HCL 20 MG PO TABS: ORAL_TABLET | ORAL | 2 refills | Status: DC

## 2020-02-23 NOTE — Progress Notes (Signed)
Phone visit Subjective:    Patient ID: Stacey Prince, female    DOB: December 31, 1995, 24 y.o.   MRN: [id number]  HPI Patient calls in today to discuss anxiety and depression since starting Prozac. She is currently taking 60mg  daily. Patient states she has been doing pretty well but would like to discuss adding another medication for her rough days.  She had a bad day this past weekend and tried to take a 4th Prozac and did not like the way she felt at all.  See GAD 7 and PHQ9 Virtual Visit via Video Note  I connected with on 02/23/20 at 10:40 AM EDT by a video enabled telemedicine application and verified that I am speaking with the correct person using two identifiers.  Location: Patient: home Provider: office    I discussed the limitations of evaluation and management by telemedicine and the availability of in person appointments. The patient expressed understanding and agreed to proceed.  History of Present Illness: States she is doing well on the Prozac 60 mg.  Likes the way it makes her feel.  Does well most days but about twice a month for 2-3 days has increased agitation, irritation and anxiety.  States she feels "off".  Has noticed it occurs about a week before her menses.  Has not identified any other specific triggers.  Currently on birth control pills.  Denies any missed pills.  Would like medication to help on those days, otherwise would like to continue Prozac. GAD 7 : Generalized Anxiety Score 02/23/2020  Nervous, Anxious, on Edge 1  Control/stop worrying 1  Worry too much - different things 1  Trouble relaxing 1  Restless 0  Easily annoyed or irritable 1  Afraid - awful might happen 0  Total GAD 7 Score 5  Anxiety Difficulty Somewhat difficult    Depression screen [facility] 2/9 02/23/2020 12/31/2019 10/29/2019 07/09/2019  Decreased Interest 0 2 0 1  Down, Depressed, Hopeless 1 2 2 1   PHQ - 2 Score 1 4 2 2   Altered sleeping 0 0 2 1  Tired, decreased  energy 0 2 1 1   Change in appetite 0 0 0 0  Feeling bad or failure about yourself  1 0 0 1  Trouble concentrating 0 2 0 0  Moving slowly or fidgety/restless 0 0 0 0  Suicidal thoughts 0 0 0 0  PHQ-9 Score 2 8 5 5   Difficult doing work/chores Somewhat difficult Somewhat difficult Not difficult at all Somewhat difficult      Observations/Objective: Today's visit was via telephone Physical exam was not possible for this visit Alert, oriented.  Cheerful affect.  Thoughts logical coherent and relevant.  Note that today is according to patient a "good day".  Assessment and Plan: Problem List Items Addressed This Visit      Other   Anxiety with depression - Primary   Relevant Medications   FLUoxetine (PROZAC) 20 MG tablet     Meds ordered this encounter  Medications  . FLUoxetine (PROZAC) 20 MG tablet    Sig: Take 3 capsules (60 mg) po qd    Dispense:  90 tablet    Refill:  2    Order Specific Question:   Supervising Provider    Answer:   07/11/2019 A [[id number]]  . clonazePAM (KLONOPIN) 0.5 MG tablet    Sig: Take 1/2-1 po BID prn agitation    Dispense:  20 tablet    Refill:  0    Order Specific  Question:   Supervising Provider    Answer:   Sallee Lange A [[id number]]     Follow Up Instructions: Continue Prozac as directed. Add low-dose Klonopin sparingly on those days per month but she needs extra medication for agitation and anxiety.  Suspect this is most likely hormonal or PMDD. Also advised patient not to take Klonopin if pregnant. Return in about 3 months (around 05/25/2020). Call back sooner if any problems.   I discussed the assessment and treatment plan with the patient. The patient was provided an opportunity to ask questions and all were answered. The patient agreed with the plan and demonstrated an understanding of the instructions.   The patient was advised to call back or seek an in-person evaluation if the symptoms worsen or if the condition fails to improve as  anticipated.  I provided 15 minutes of non-face-to-face time during this encounter.  Review of Systems     Objective:   Physical Exam        Assessment & Plan:

## 2020-02-23 NOTE — Telephone Encounter (Signed)
Ms. Stacey Prince are scheduled for a virtual visit with your provider today.    Just as we do with appointments in the office, we must obtain your consent to participate.  Your consent will be active for this visit and any virtual visit you may have with one of our providers in the next 365 days.    If you have a MyChart account, I can also send a copy of this consent to you electronically.  All virtual visits are billed to your insurance company just like a traditional visit in the office.  As this is a virtual visit, video technology does not allow for your provider to perform a traditional examination.  This may limit your provider's ability to fully assess your condition.  If your provider identifies any concerns that need to be evaluated in person or the need to arrange testing such as labs, EKG, etc, we will make arrangements to do so.    Although advances in technology are sophisticated, we cannot ensure that it will always work on either your end or our end.  If the connection with a video visit is poor, we may have to switch to a telephone visit.  With either a video or telephone visit, we are not always able to ensure that we have a secure connection.   I need to obtain your verbal consent now.   Are you willing to proceed with your visit today?   Stacey Prince has provided verbal consent on 02/23/2020 for a virtual visit (video or telephone).   Haze Rushing, LPN 05/01/2799  3:49 AM

## 2020-03-09 ENCOUNTER — Other Ambulatory Visit: Payer: Self-pay

## 2020-03-09 ENCOUNTER — Telehealth (INDEPENDENT_AMBULATORY_CARE_PROVIDER_SITE_OTHER): Payer: BC Managed Care – PPO | Admitting: Family Medicine

## 2020-03-09 ENCOUNTER — Encounter: Payer: Self-pay | Admitting: Family Medicine

## 2020-03-09 ENCOUNTER — Telehealth: Payer: Self-pay | Admitting: *Deleted

## 2020-03-09 DIAGNOSIS — J019 Acute sinusitis, unspecified: Secondary | ICD-10-CM

## 2020-03-09 DIAGNOSIS — B9689 Other specified bacterial agents as the cause of diseases classified elsewhere: Secondary | ICD-10-CM

## 2020-03-09 MED ORDER — AZITHROMYCIN 250 MG PO TABS
ORAL_TABLET | ORAL | 0 refills | Status: DC
Start: 2020-03-09 — End: 2020-07-28

## 2020-03-09 NOTE — Progress Notes (Signed)
   Subjective:    Patient ID: Stacey Prince, female    DOB: 1996-09-22, 24 y.o.   MRN: [id number]  Sinusitis This is a new problem. Episode onset: 2 days ago. Associated symptoms include congestion, coughing, ear pain, a hoarse voice, sinus pressure and a sore throat. Treatments tried: sudafed, flonase. The treatment provided no relief.  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  Location: Patient: home Provider: office    I discussed the limitations of evaluation and management by telemedicine and the availability of in person appointments. The patient expressed understanding and agreed to proceed.  History of Present Illness:    Observations/Objective:   Assessment and Plan:   Follow Up Instructions:    I discussed the assessment and treatment plan with the patient. The patient was provided an opportunity to ask questions and all were answered. The patient agreed with the plan and demonstrated an understanding of the instructions.   The patient was advised to call back or seek an in-person evaluation if the symptoms worsen or if the condition fails to improve as anticipated.  I provided 20 minutes of non-face-to-face time during this encounter.   Review of Systems  HENT: Positive for congestion, ear pain, hoarse voice, sinus pressure and sore throat.   Respiratory: Positive for cough.        Objective:   Physical Exam  Today's visit was via telephone Physical exam was not possible for this visit   Patient stay home the next few days await the results of the test warning signs were discussed    Assessment & Plan:  Acute  rhinosinusitis Antibiotic prescribed Follow-up if progressive troubles Patient did come to the office for outside Covid test this was sent off Await the results

## 2020-03-09 NOTE — Telephone Encounter (Signed)
Ms. Stacey Prince are scheduled for a virtual visit with your provider today.    Just as we do with appointments in the office, we must obtain your consent to participate.  Your consent will be active for this visit and any virtual visit you may have with one of our providers in the next 365 days.    If you have a MyChart account, I can also send a copy of this consent to you electronically.  All virtual visits are billed to your insurance company just like a traditional visit in the office.  As this is a virtual visit, video technology does not allow for your provider to perform a traditional examination.  This may limit your provider's ability to fully assess your condition.  If your provider identifies any concerns that need to be evaluated in person or the need to arrange testing such as labs, EKG, etc, we will make arrangements to do so.    Although advances in technology are sophisticated, we cannot ensure that it will always work on either your end or our end.  If the connection with a video visit is poor, we may have to switch to a telephone visit.  With either a video or telephone visit, we are not always able to ensure that we have a secure connection.   I need to obtain your verbal consent now.   Are you willing to proceed with your visit today?   Stacey Prince has provided verbal consent on 03/09/2020 for a virtual visit (video or telephone).   Haze Rushing, LPN 9/45/0388  82:80 AM

## 2020-03-11 LAB — NOVEL CORONAVIRUS, NAA: SARS-CoV-2, NAA: DETECTED — AB

## 2020-03-11 LAB — SARS-COV-2, NAA 2 DAY TAT

## 2020-03-11 LAB — SPECIMEN STATUS REPORT

## 2020-03-13 ENCOUNTER — Encounter: Payer: Self-pay | Admitting: Family Medicine

## 2020-04-04 ENCOUNTER — Other Ambulatory Visit: Payer: Self-pay | Admitting: *Deleted

## 2020-04-04 ENCOUNTER — Telehealth: Payer: Self-pay | Admitting: Family Medicine

## 2020-04-04 MED ORDER — FLUCONAZOLE 150 MG PO TABS
ORAL_TABLET | ORAL | 0 refills | Status: DC
Start: 2020-04-04 — End: 2020-07-28

## 2020-04-04 NOTE — Telephone Encounter (Signed)
Patient is requesting something to be called in for yeast infection to [facility]

## 2020-04-04 NOTE — Telephone Encounter (Signed)
Pt having white vaginal discharge and vaginal itching. Diflucan 150mg  #2 one today and repeat in 3 days per dr . Med sent to pharm and pt was notified if not better to follow up in office.

## 2020-05-09 ENCOUNTER — Other Ambulatory Visit: Payer: Self-pay | Admitting: Nurse Practitioner

## 2020-07-10 ENCOUNTER — Encounter: Payer: Self-pay | Admitting: Nurse Practitioner

## 2020-07-10 ENCOUNTER — Telehealth: Payer: Self-pay | Admitting: Nurse Practitioner

## 2020-07-10 ENCOUNTER — Other Ambulatory Visit: Payer: Self-pay | Admitting: Family Medicine

## 2020-07-10 MED ORDER — CLONAZEPAM 0.5 MG PO TABS: ORAL_TABLET | ORAL | 0 refills | Status: DC

## 2020-07-10 MED ORDER — FLUOXETINE HCL 20 MG PO CAPS: 60.0000 mg | ORAL_CAPSULE | Freq: Every day | ORAL | 0 refills | Status: DC

## 2020-07-10 NOTE — Telephone Encounter (Signed)
Patient has appointment 11/5 with Eber Jones for medication followup but is needing medication called in completely out

## 2020-07-11 ENCOUNTER — Ambulatory Visit
Admission: EM | Admit: 2020-07-11 | Discharge: 2020-07-11 | Disposition: A | Discharge status: Home / Self Care | Payer: BC Managed Care – PPO

## 2020-07-11 ENCOUNTER — Other Ambulatory Visit: Payer: Self-pay

## 2020-07-11 DIAGNOSIS — R040 Epistaxis: Secondary | ICD-10-CM | POA: Diagnosis not present

## 2020-07-11 NOTE — Telephone Encounter (Signed)
I senty these rxs in last night and sent pt a mychart message thanks

## 2020-07-11 NOTE — ED Provider Notes (Signed)
[facility]   [id number] 07/11/20 Arrival Time: 1332   Chief Complaint  Patient presents with  . Epistaxis     SUBJECTIVE: History from: patient.  Stacey Prince is a 24 y.o. female presented to the urgent care for complaint of nosebleed that occurred today.  Reports she has nosebleed in the morning and then afternoon.  Denies any precipitating event.  Currently bleeding is controlled.  Has not tried any OTC medication.  Denies aggravating factors.  Reports similar symptoms in the past.   Denies fever, chills, fatigue, sinus pain, rhinorrhea, sore throat, SOB, wheezing, chest pain, nausea, changes in bowel or bladder habits.    .  ROS: As per HPI.  All other pertinent ROS negative.     No past medical history on file. No past surgical history on file. Allergies  Allergen Reactions  . Amoxicillin   . Zoloft [Sertraline] Other (See Comments)    Flat affect on low dose   No current facility-administered medications on file prior to encounter.   Current Outpatient Medications on File Prior to Encounter  Medication Sig Dispense Refill  . azithromycin (ZITHROMAX Z-PAK) 250 MG tablet Take 2 tablets (500 mg) on  Day 1,  followed by 1 tablet (250 mg) once daily on Days 2 through 5. 6 each 0  . clonazePAM (KLONOPIN) 0.5 MG tablet Take 1/2-1 po BID prn agitation 20 tablet 0  . fluconazole (DIFLUCAN) 150 MG tablet Take one tablet today and repeat in 3 days 2 tablet 0  . FLUoxetine (PROZAC) 20 MG capsule Take 3 capsules (60 mg total) by mouth daily. 90 capsule 0  . fluticasone (FLONASE) 50 MCG/ACT nasal spray Place 1 spray into both nostrils daily for 14 days. 16 g 0  . norethindrone-ethinyl estradiol 1/35 (ALAYCEN 1/35) tablet Take 1 tablet by mouth daily. 30 tablet 11   Social History   Socioeconomic History  . Marital status: Single    Spouse name: Not on file  . Number of children: Not on file  . Years of education: Not on file  . Highest education level: Not  on file  Occupational History  . Not on file  Tobacco Use  . Smoking status: Never Smoker  . Smokeless tobacco: Never Used  Substance and Sexual Activity  . Alcohol use: Not on file  . Drug use: Not on file  . Sexual activity: Yes    Birth control/protection: Pill  Other Topics Concern  . Not on file  Social History Narrative  . Not on file   Social Determinants of Health   Financial Resource Strain:   . Difficulty of Paying Living Expenses: Not on file  Food Insecurity:   . Worried About Programme researcher, broadcasting/film/video in the Last Year: Not on file  . Ran Out of Food in the Last Year: Not on file  Transportation Needs:   . Lack of Transportation (Medical): Not on file  . Lack of Transportation (Non-Medical): Not on file  Physical Activity:   . Days of Exercise per Week: Not on file  . Minutes of Exercise per Session: Not on file  Stress:   . Feeling of Stress : Not on file  Social Connections:   . Frequency of Communication with Friends and Family: Not on file  . Frequency of Social Gatherings with Friends and Family: Not on file  . Attends Religious Services: Not on file  . Active Member of Clubs or Organizations: Not on file  . Attends Banker  Meetings: Not on file  . Marital Status: Not on file  Intimate Partner Violence:   . Fear of Current or Ex-Partner: Not on file  . Emotionally Abused: Not on file  . Physically Abused: Not on file  . Sexually Abused: Not on file   Family History  Problem Relation Age of Onset  . Healthy Mother   . Healthy Father     OBJECTIVE:  Vitals:   07/11/20 1340 07/11/20 1342  BP: 124/79 124/79  Pulse: (!) 101 (!) 101  Resp: 20   Temp: 98.2 F (36.8 C) 98.2 F (36.8 C)  SpO2: 96% 96%     Physical Exam Vitals and nursing note reviewed.  Constitutional:      General: She is not in acute distress.    Appearance: Normal appearance. She is normal weight. She is not ill-appearing, toxic-appearing or diaphoretic.  HENT:      Head: Normocephalic.     Nose: No nasal deformity, septal deviation, signs of injury, nasal tenderness or congestion.     Right Nostril: Epistaxis present. No foreign body, septal hematoma or occlusion.     Left Nostril: No epistaxis, septal hematoma or occlusion.  Cardiovascular:     Rate and Rhythm: Normal rate and regular rhythm.     Pulses: Normal pulses.     Heart sounds: Normal heart sounds. No murmur heard.  No friction rub. No gallop.   Pulmonary:     Effort: Pulmonary effort is normal. No respiratory distress.     Breath sounds: Normal breath sounds. No stridor. No wheezing, rhonchi or rales.  Chest:     Chest wall: No tenderness.  Neurological:     Mental Status: She is alert and oriented to person, place, and time.      LABS:  No results found for this or any previous visit (from the past 24 hour(s)).   ASSESSMENT & PLAN:  1. Epistaxis     No orders of the defined types were placed in this encounter.   Discharge instructions  When you have a nosebleed follow these steps: May pinch nose with a clean towel or tissue Keep pinching  nose for 10-minute/ do not leg go May use a nose spray decongestant such as Afrin Avoid blowing your nose or sneeze  Try not to strain Avoid aspirin or any medication that can thin the blood Follow-up with PCP Return or go to ED for worsening symptoms  Reviewed expectations re: course of current medical issues. Questions answered. Outlined signs and symptoms indicating need for more acute intervention. Patient verbalized understanding. After Visit Summary given.         Durward Parcel, FNP 07/11/20 1409

## 2020-07-11 NOTE — Telephone Encounter (Signed)
Patient has appointment 11/5

## 2020-07-11 NOTE — ED Triage Notes (Signed)
Pt presents with nose bleed that first began this morning and then again this afternoon about 1300. Bleeding controlled upon assessment

## 2020-07-11 NOTE — Telephone Encounter (Signed)
Last check up for anxiety was 02/23/20. Requesting fluoxetine and clonazepam. Has appt on 11/5

## 2020-07-11 NOTE — Discharge Instructions (Addendum)
When you have a nosebleed follow these steps: May pinch nose with a clean towel or tissue Keep pinching  nose for 10-minute/ do not leg go May use a nose spray decongestant such as Afrin Avoid blowing your nose or sneeze  Try not to strain Avoid aspirin or any medication that can thin the blood Follow-up with PCP Return or go to ED for worsening symptoms

## 2020-07-28 ENCOUNTER — Other Ambulatory Visit: Payer: Self-pay

## 2020-07-28 ENCOUNTER — Ambulatory Visit: Payer: BC Managed Care – PPO | Admitting: Nurse Practitioner

## 2020-07-28 ENCOUNTER — Encounter: Payer: Self-pay | Admitting: Nurse Practitioner

## 2020-07-28 VITALS — BP 126/78 | HR 105 | Temp 97.8°F | Wt 161.6 lb

## 2020-07-28 DIAGNOSIS — F418 Other specified anxiety disorders: Secondary | ICD-10-CM

## 2020-07-28 DIAGNOSIS — Z8709 Personal history of other diseases of the respiratory system: Secondary | ICD-10-CM | POA: Diagnosis not present

## 2020-07-28 DIAGNOSIS — R040 Epistaxis: Secondary | ICD-10-CM | POA: Diagnosis not present

## 2020-07-28 NOTE — Progress Notes (Signed)
° °  Subjective:    Patient ID: Stacey Prince, female    DOB: 1996/07/10, 24 y.o.   MRN: [id number]  HPI    Review of Systems     Objective:   Physical Exam        Assessment & Plan:

## 2020-07-28 NOTE — Progress Notes (Signed)
   Subjective:    Patient ID: Stacey Prince, female    DOB: November 29, 1995, 24 y.o.   MRN: [id number]  HPI Patient following up on anxiety and depression.  No concerns. Reports trying the 80 mg of Prozac, this caused increased agitation, she returned to the prescribed 60 mg. No complaints. Ocasionally taking the PRN Klonopin, about once per week, up the 3 per week around menses. This is working well for her. Denies thoughts of hurting self or others.    Review of Systems Pt denies HA, N, V, constipation, diarrhea. Denies chest tightness, palpitations, lightheadedness, dizziness, or fatigue. Denies cough, SOB, fever or chills.      Depression screen [facility] 2/9 07/28/2020 02/23/2020 12/31/2019 10/29/2019 07/09/2019  Decreased Interest 1 0 2 0 1  Down, Depressed, Hopeless 1 1 2 2 1   PHQ - 2 Score 2 1 4 2 2   Altered sleeping 0 0 0 2 1  Tired, decreased energy 1 0 2 1 1   Change in appetite 0 0 0 0 0  Feeling bad or failure about yourself  1 1 0 0 1  Trouble concentrating 0 0 2 0 0  Moving slowly or fidgety/restless 0 0 0 0 0  Suicidal thoughts 0 0 0 0 0  PHQ-9 Score 4 2 8 5 5   Difficult doing work/chores Somewhat difficult Somewhat difficult Somewhat difficult Not difficult at all Somewhat difficult    GAD 7 : Generalized Anxiety Score 07/28/2020 02/23/2020  Nervous, Anxious, on Edge 1 1  Control/stop worrying 1 1  Worry too much - different things 1 1  Trouble relaxing 0 1  Restless 0 0  Easily annoyed or irritable 2 1  Afraid - awful might happen 0 0  Total GAD 7 Score 5 5  Anxiety Difficulty Somewhat difficult Somewhat difficult     Objective:   Physical Exam General: Alert, well groomed with coherent thought and judgement.   CV: heart sounds are normal without murmur or gallops. Regular rate and rhythm. Skin is warm and dry.  Pulm: Breath sounds are equal and clear bilaterally without wheezing or rhonchi.   Today's Vitals   07/28/20 0947  BP: 126/78  Pulse: (!) 105  Temp:  97.8 F (36.6 C)  SpO2: 98%  Weight: 73.3 kg   Body mass index is 27.31 kg/m.      Assessment & Plan:   Problem List Items Addressed This Visit      Other   Anxiety with depression - Primary      Continue with current plan.  Encouraged patient to notify the office if any worsening signs or symptoms occur. Seek immediate medical attention if thoughts of hurting yourself or others occur.  Informed patient to discontinue Klonopin if she becomes pregnant or plans conception.  Return in about 3 months (around 10/28/2020) for Anxiety and depression. Virtual ok.

## 2020-08-03 IMAGING — US ULTRASOUND RIGHT BREAST LIMITED
1 series · 4 of 4 positions shown · non-contrast
Comparison: Previous exam(s).

CLINICAL DATA: 23-year-old female presenting for follow-up of a
probably benign right breast mass.

EXAM:
ULTRASOUND OF THE RIGHT BREAST

[Series 1: ultrasound right breast limited · 0.07mm/px · 4 of 4 slices shown]
[im 1/4]
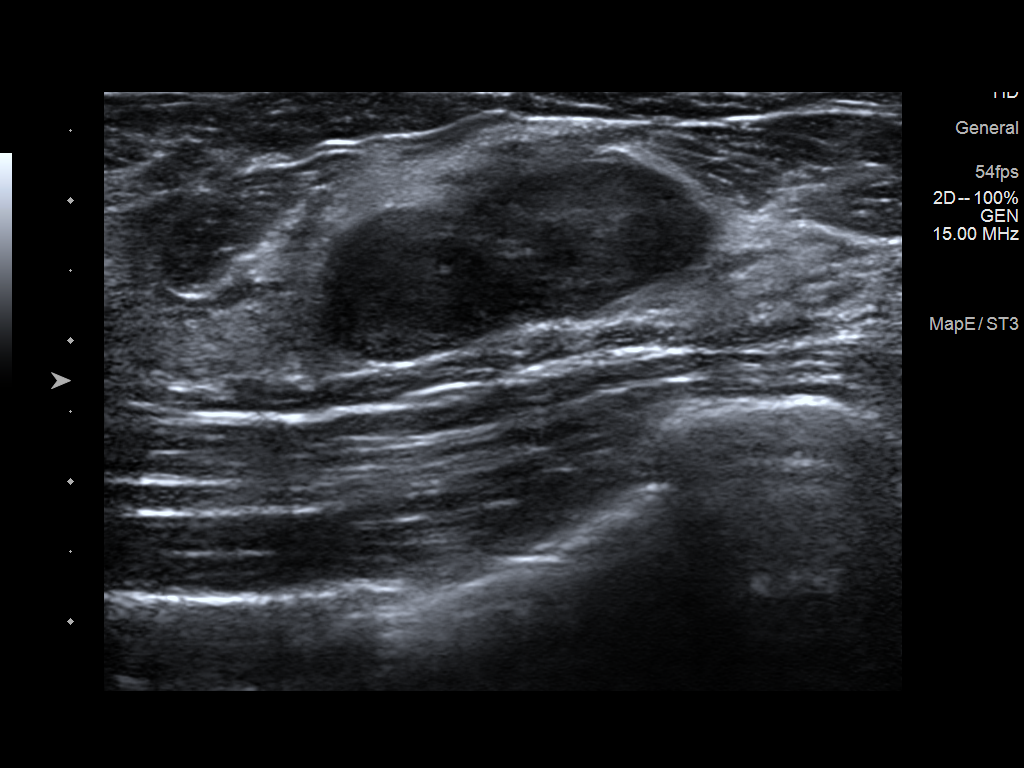
[im 2/4]
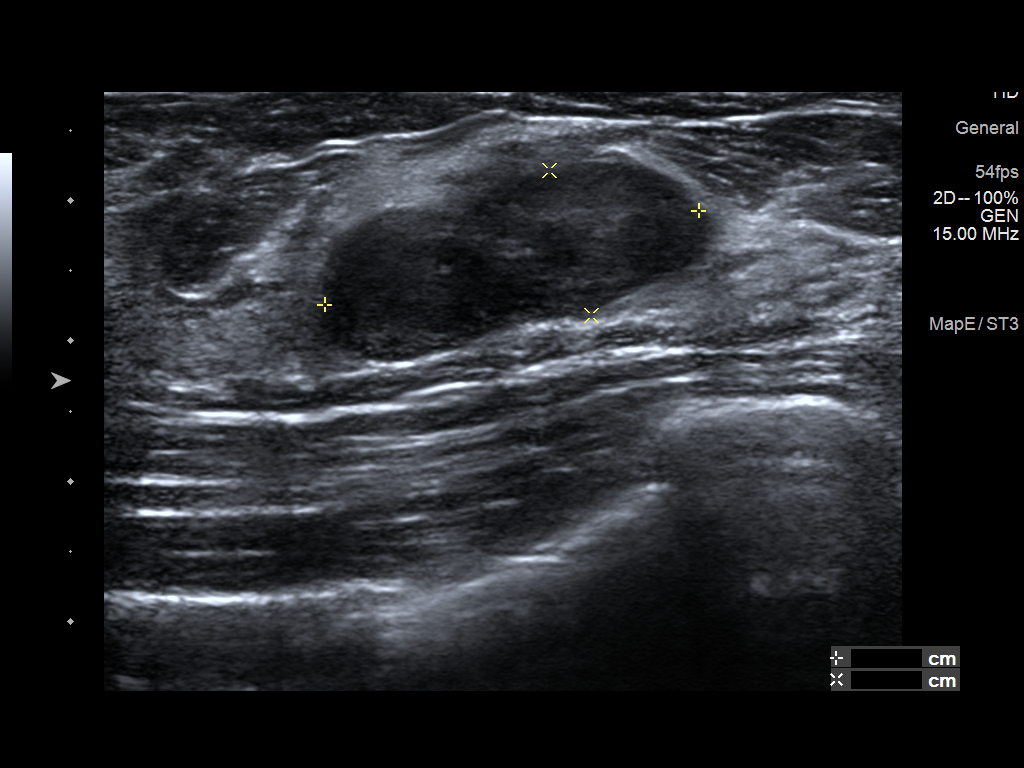
[im 3/4]
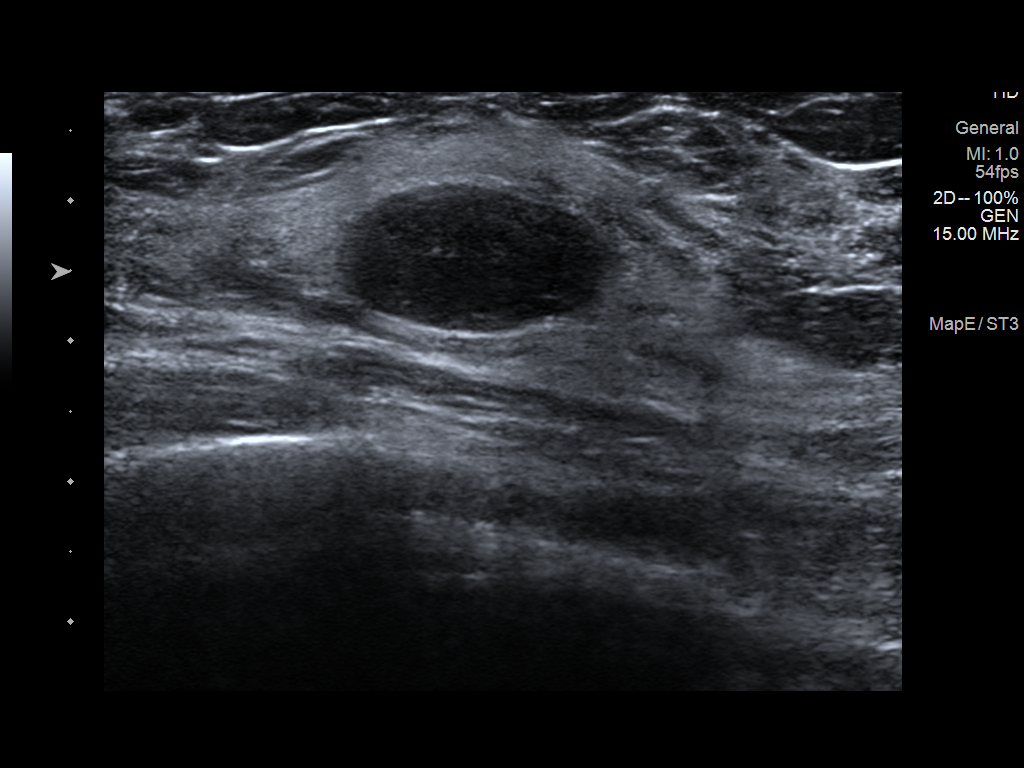
[im 4/4]
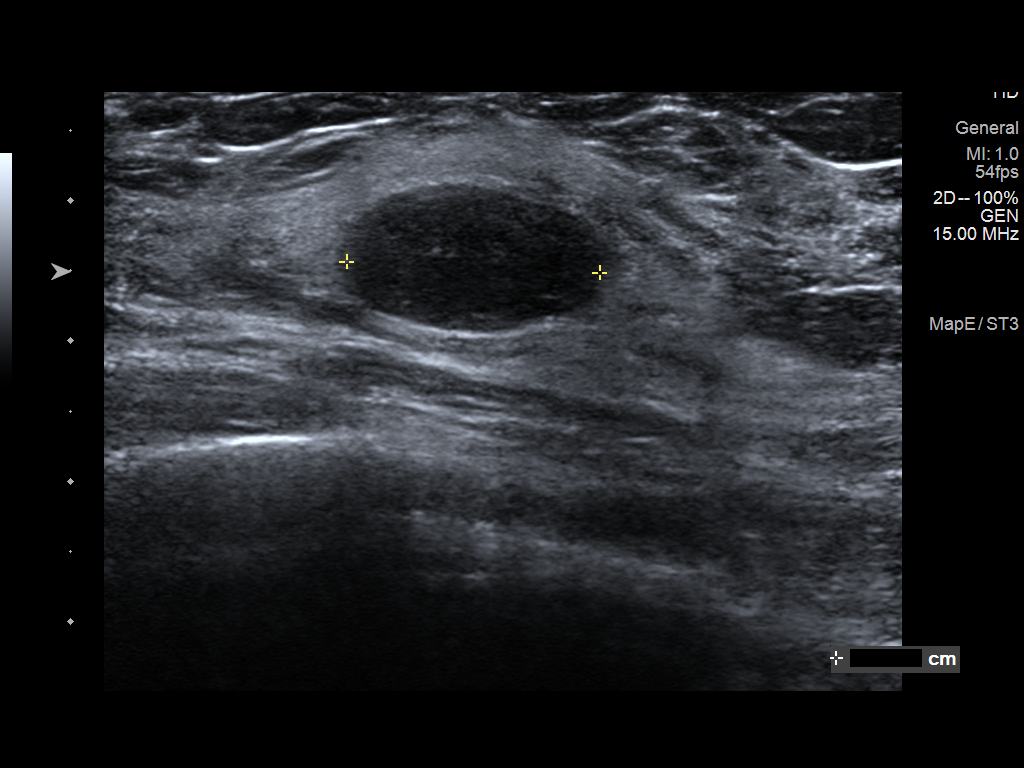

[4 of 4 positions shown; findings below may reference images not displayed]

FINDINGS: Ultrasound of the right breast at [DATE], 4 cm from the nipple
demonstrates a stable oval hypoechoic mass measuring 2.8 x 1.1 x
cm, previously 2.7 x 1.0 x 1.7 cm.
IMPRESSION: The right breast mass at [DATE] is stable.

RECOMMENDATION:
Six-month follow-up right breast ultrasound.

I have discussed the findings and recommendations with the patient.
Results were also provided in writing at the conclusion of the
visit. If applicable, a reminder letter will be sent to the patient
regarding the next appointment.

BI-RADS CATEGORY  3: Probably benign.

## 2020-08-08 ENCOUNTER — Other Ambulatory Visit: Payer: Self-pay | Admitting: Family Medicine

## 2020-09-09 ENCOUNTER — Other Ambulatory Visit: Payer: Self-pay | Admitting: Nurse Practitioner

## 2020-10-07 ENCOUNTER — Other Ambulatory Visit: Payer: Self-pay | Admitting: Family Medicine

## 2020-10-09 ENCOUNTER — Other Ambulatory Visit: Payer: Self-pay | Admitting: Nurse Practitioner

## 2020-10-09 ENCOUNTER — Encounter: Payer: Self-pay | Admitting: Family Medicine

## 2020-10-09 ENCOUNTER — Other Ambulatory Visit: Payer: Self-pay | Admitting: Family Medicine

## 2020-10-09 ENCOUNTER — Encounter: Payer: Self-pay | Admitting: Nurse Practitioner

## 2020-10-10 ENCOUNTER — Other Ambulatory Visit: Payer: Self-pay | Admitting: Family Medicine

## 2020-10-10 MED ORDER — FLUOXETINE HCL 20 MG PO CAPS
60.0000 mg | ORAL_CAPSULE | Freq: Every day | ORAL | 4 refills | Status: DC
Start: 2020-10-10 — End: 2021-02-09

## 2020-10-10 MED ORDER — CLONAZEPAM 0.5 MG PO TABS
ORAL_TABLET | ORAL | 1 refills | Status: DC
Start: 2020-10-10 — End: 2021-02-14

## 2021-01-08 DIAGNOSIS — Z6827 Body mass index (BMI) 27.0-27.9, adult: Secondary | ICD-10-CM | POA: Diagnosis not present

## 2021-01-08 DIAGNOSIS — Z01419 Encounter for gynecological examination (general) (routine) without abnormal findings: Secondary | ICD-10-CM | POA: Diagnosis not present

## 2021-01-23 ENCOUNTER — Encounter: Payer: Self-pay | Admitting: Nurse Practitioner

## 2021-01-25 ENCOUNTER — Other Ambulatory Visit: Payer: Self-pay

## 2021-01-25 ENCOUNTER — Encounter: Payer: Self-pay | Admitting: Family Medicine

## 2021-01-25 ENCOUNTER — Ambulatory Visit: Payer: BC Managed Care – PPO | Admitting: Family Medicine

## 2021-01-25 VITALS — BP 127/86 | HR 103 | Temp 97.3°F | Ht 64.5 in | Wt 161.0 lb

## 2021-01-25 DIAGNOSIS — R1084 Generalized abdominal pain: Secondary | ICD-10-CM | POA: Diagnosis not present

## 2021-01-25 DIAGNOSIS — R197 Diarrhea, unspecified: Secondary | ICD-10-CM

## 2021-01-25 NOTE — Patient Instructions (Signed)
Low-FODMAP Eating Plan  FODMAP stands for fermentable oligosaccharides, disaccharides, monosaccharides, and polyols. These are sugars that are hard for some people to digest. A low-FODMAP eating plan may help some people who have irritable bowel syndrome (IBS) and certain other bowel (intestinal) diseases to manage their symptoms. This meal plan can be complicated to follow. Work with a diet and nutrition specialist (dietitian) to make a low-FODMAP eating plan that is right for you. A dietitian can help make sure that you get enough nutrition from this diet. What are tips for following this plan? Reading food labels  Check labels for hidden FODMAPs such as: ? High-fructose syrup. ? Honey. ? Agave. ? Natural fruit flavors. ? Onion or garlic powder.  Choose low-FODMAP foods that contain 3-4 grams of fiber per serving.  Check food labels for serving sizes. Eat only one serving at a time to make sure FODMAP levels stay low. Shopping  Shop with a list of foods that are recommended on this diet and make a meal plan. Meal planning  Follow a low-FODMAP eating plan for up to 6 weeks, or as told by your health care provider or dietitian.  To follow the eating plan: 1. Eliminate high-FODMAP foods from your diet completely. Choose only low-FODMAP foods to eat. You will do this for 2-6 weeks. 2. Gradually reintroduce high-FODMAP foods into your diet one at a time. Most people should wait a few days before introducing the next new high-FODMAP food into their meal plan. Your dietitian can recommend how quickly you may reintroduce foods. 3. Keep a daily record of what and how much you eat and drink. Make note of any symptoms that you have after eating. 4. Review your daily record with a dietitian regularly to identify which foods you can eat and which foods you should avoid. General tips  Drink enough fluid each day to keep your urine pale yellow.  Avoid processed foods. These often have added sugar  and may be high in FODMAPs.  Avoid most dairy products, whole grains, and sweeteners.  Work with a dietitian to make sure you get enough fiber in your diet.  Avoid high FODMAP foods at meals to manage symptoms. Recommended foods Fruits Bananas, oranges, tangerines, lemons, limes, blueberries, raspberries, strawberries, grapes, cantaloupe, honeydew melon, kiwi, papaya, passion fruit, and pineapple. Limited amounts of dried cranberries, banana chips, and shredded coconut. Vegetables Eggplant, zucchini, cucumber, peppers, green beans, bean sprouts, lettuce, arugula, kale, Swiss chard, spinach, collard greens, bok choy, summer squash, potato, and tomato. Limited amounts of corn, carrot, and sweet potato. Green parts of scallions. Grains Gluten-free grains, such as rice, oats, buckwheat, quinoa, corn, polenta, and millet. Gluten-free pasta, bread, or cereal. Rice noodles. Corn tortillas. Meats and other proteins Unseasoned beef, pork, poultry, or fish. Eggs. Bacon. Tofu (firm) and tempeh. Limited amounts of nuts and seeds, such as almonds, walnuts, brazil nuts, pecans, peanuts, nut butters, pumpkin seeds, chia seeds, and sunflower seeds. Dairy Lactose-free milk, yogurt, and kefir. Lactose-free cottage cheese and ice cream. Non-dairy milks, such as almond, coconut, hemp, and rice milk. Non-dairy yogurt. Limited amounts of goat cheese, brie, mozzarella, parmesan, swiss, and other hard cheeses. Fats and oils Butter-free spreads. Vegetable oils, such as olive, canola, and sunflower oil. Seasoning and other foods Artificial sweeteners with names that do not end in "ol," such as aspartame, saccharine, and stevia. Maple syrup, white table sugar, raw sugar, brown sugar, and molasses. Mayonnaise, soy sauce, and tamari. Fresh basil, coriander, parsley, rosemary, and thyme. Beverages Water and   mineral water. Sugar-sweetened soft drinks. Small amounts of orange juice or cranberry juice. Black and green tea.  Most dry wines. Coffee. The items listed above may not be a complete list of foods and beverages you can eat. Contact a dietitian for more information. Foods to avoid Fruits Fresh, dried, and juiced forms of apple, pear, watermelon, peach, plum, cherries, apricots, blackberries, boysenberries, figs, nectarines, and mango. Avocado. Vegetables Chicory root, artichoke, asparagus, cabbage, snow peas, Brussels sprouts, broccoli, sugar snap peas, mushrooms, celery, and cauliflower. Onions, garlic, leeks, and the white part of scallions. Grains Wheat, including kamut, durum, and semolina. Barley and bulgur. Couscous. Wheat-based cereals. Wheat noodles, bread, crackers, and pastries. Meats and other proteins Fried or fatty meat. Sausage. Cashews and pistachios. Soybeans, baked beans, black beans, chickpeas, kidney beans, fava beans, navy beans, lentils, black-eyed peas, and split peas. Dairy Milk, yogurt, ice cream, and soft cheese. Cream and sour cream. Milk-based sauces. Custard. Buttermilk. Soy milk. Seasoning and other foods Any sugar-free gum or candy. Foods that contain artificial sweeteners such as sorbitol, mannitol, isomalt, or xylitol. Foods that contain honey, high-fructose corn syrup, or agave. Bouillon, vegetable stock, beef stock, and chicken stock. Garlic and onion powder. Condiments made with onion, such as hummus, chutney, pickles, relish, salad dressing, and salsa. Tomato paste. Beverages Chicory-based drinks. Coffee substitutes. Chamomile tea. Fennel tea. Sweet or fortified wines such as port or sherry. Diet soft drinks made with isomalt, mannitol, maltitol, sorbitol, or xylitol. Apple, pear, and mango juice. Juices with high-fructose corn syrup. The items listed above may not be a complete list of foods and beverages you should avoid. Contact a dietitian for more information. Summary  FODMAP stands for fermentable oligosaccharides, disaccharides, monosaccharides, and polyols. These  are sugars that are hard for some people to digest.  A low-FODMAP eating plan is a short-term diet that helps to ease symptoms of certain bowel diseases.  The eating plan usually lasts up to 6 weeks. After that, high-FODMAP foods are reintroduced gradually and one at a time. This can help you find out which foods may be causing symptoms.  A low-FODMAP eating plan can be complicated. It is best to work with a dietitian who has experience with this type of plan. This information is not intended to replace advice given to you by your health care provider. Make sure you discuss any questions you have with your health care provider. Document Revised: 01/27/2020 Document Reviewed: 01/27/2020 Elsevier Patient Education  2021 Elsevier Inc.  

## 2021-01-25 NOTE — Progress Notes (Signed)
Patient ID: Stacey Prince, female    DOB: October 31, 1995, 25 y.o.   MRN: [id number]   Chief Complaint  Patient presents with  . Diarrhea    Chronic on and off , burning sensation and discomfort in abd , flare started Sunday 3 days ago- kaopectate helped    Subjective:  CC: diarrhea, chronic problem  This is a chronic problem.  Presents today with a complaint of chronic diarrhea which occurs on and off, burning sensation in the abdominal area and discomfort.  She denies any blood in her stools.  Reports that when the diarrhea occurs, she has the sensation of wanting to throw up, would not call it nausea.  Pain in the abdominal area is generalized.  Reports that her BMs are rarely solid, occurs 2-3 times per week wonders if it is food related.  Denies fever, chills, weight loss, appetite changes and fatigue.  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    Medical History Stacey Prince has no past medical history on file.   Outpatient Encounter Medications as of 01/25/2021  Medication Sig  . clonazePAM (KLONOPIN) 0.5 MG tablet Take 1/2-1 po BID prn agitation  . FLUoxetine (PROZAC) 20 MG capsule Take 3 capsules (60 mg total) by mouth daily.  . fluticasone (FLONASE) 50 MCG/ACT nasal spray Place 1 spray into both nostrils daily for 14 days.  . norethindrone-ethinyl estradiol 1/35 (ALAYCEN 1/35) tablet Take 1 tablet by mouth daily.   No facility-administered encounter medications on file as of 01/25/2021.     Review of Systems  Constitutional: Negative for appetite change, chills, fatigue, fever and unexpected weight change.  Respiratory: Negative for shortness of breath.   Cardiovascular: Negative for chest pain.  Gastrointestinal: Positive for abdominal pain and diarrhea. Negative for blood in stool.       When episodes occur feels like needs to throw up but doesn't necessarily feel nauseated.   Endocrine: Negative for cold intolerance, heat intolerance, polydipsia and polyuria.   Neurological: Negative for headaches.     Vitals BP 127/86   Pulse (!) 103   Temp (!) 97.3 F (36.3 C)   Ht 5' 4.5" (1.638 m)   Wt 161 lb (73 kg)   LMP 01/02/2021   SpO2 98%   BMI 27.21 kg/m   Objective:   Physical Exam Vitals reviewed.  Constitutional:      Appearance: Normal appearance. She is not ill-appearing.  Cardiovascular:     Rate and Rhythm: Normal rate and regular rhythm.     Heart sounds: Normal heart sounds.  Pulmonary:     Effort: Pulmonary effort is normal.     Breath sounds: Normal breath sounds.  Abdominal:     General: Bowel sounds are normal. There is no distension.     Tenderness: There is no abdominal tenderness.  Skin:    General: Skin is warm and dry.  Neurological:     General: No focal deficit present.     Mental Status: She is alert.  Psychiatric:        Behavior: Behavior normal.      Assessment and Plan   1. Diarrhea, unspecified type - CBC with Differential - Comprehensive Metabolic Panel (CMET) - Amylase - Lipase  2. Generalized abdominal pain - CBC with Differential - Comprehensive Metabolic Panel (CMET) - Amylase - Lipase   Abdominal exam negative for pain today.  Reports she has felt better today than this entire week.  We will get labs today.  Recommend  food journal, see if there is any association with food groups.  Information provided today on a low FODMAP diet.  Carafate offered, declined.  Agrees with plan of care discussed today. Understands warning signs to seek further care: chest pain, shortness of breath, any significant change in health.  Understands to follow-up soon for medication management, will notify once results of lab work are available, follow-up for diarrhea and abdominal pain can be determined at that time.  It is likely that she will need a gastroenterologist specialist referral if definitive diagnosis is desired.      Dorena Bodo, NP 01/25/2021

## 2021-01-26 LAB — CBC WITH DIFFERENTIAL/PLATELET
Basophils Absolute: 0 10*3/uL (ref 0.0–0.2)
Basos: 1 %
EOS (ABSOLUTE): 0.2 10*3/uL (ref 0.0–0.4)
Eos: 3 %
Hematocrit: 44 % (ref 34.0–46.6)
Hemoglobin: 15 g/dL (ref 11.1–15.9)
Immature Grans (Abs): 0 10*3/uL (ref 0.0–0.1)
Immature Granulocytes: 0 %
Lymphocytes Absolute: 1.5 10*3/uL (ref 0.7–3.1)
Lymphs: 22 %
MCH: 32.2 pg (ref 26.6–33.0)
MCHC: 34.1 g/dL (ref 31.5–35.7)
MCV: 94 fL (ref 79–97)
Monocytes Absolute: 0.5 10*3/uL (ref 0.1–0.9)
Monocytes: 8 %
Neutrophils Absolute: 4.4 10*3/uL (ref 1.4–7.0)
Neutrophils: 66 %
Platelets: 258 10*3/uL (ref 150–450)
RBC: 4.66 x10E6/uL (ref 3.77–5.28)
RDW: 12 % (ref 11.7–15.4)
WBC: 6.6 10*3/uL (ref 3.4–10.8)

## 2021-01-26 LAB — COMPREHENSIVE METABOLIC PANEL
ALT: 15 IU/L (ref 0–32)
AST: 15 IU/L (ref 0–40)
Albumin/Globulin Ratio: 2.4 — ABNORMAL HIGH (ref 1.2–2.2)
Albumin: 5 g/dL (ref 3.9–5.0)
Alkaline Phosphatase: 54 IU/L (ref 44–121)
BUN/Creatinine Ratio: 12 (ref 9–23)
BUN: 10 mg/dL (ref 6–20)
Bilirubin Total: 0.2 mg/dL (ref 0.0–1.2)
CO2: 22 mmol/L (ref 20–29)
Calcium: 10 mg/dL (ref 8.7–10.2)
Chloride: 101 mmol/L (ref 96–106)
Creatinine, Ser: 0.81 mg/dL (ref 0.57–1.00)
Globulin, Total: 2.1 g/dL (ref 1.5–4.5)
Glucose: 77 mg/dL (ref 65–99)
Potassium: 4.3 mmol/L (ref 3.5–5.2)
Sodium: 141 mmol/L (ref 134–144)
Total Protein: 7.1 g/dL (ref 6.0–8.5)
eGFR: 104 mL/min/{1.73_m2} (ref >59)

## 2021-01-26 LAB — LIPASE: Lipase: 34 U/L (ref 14–72)

## 2021-01-26 LAB — AMYLASE: Amylase: 59 U/L (ref 31–110)

## 2021-02-05 ENCOUNTER — Telehealth: Payer: Self-pay

## 2021-02-05 ENCOUNTER — Other Ambulatory Visit: Payer: Self-pay | Admitting: *Deleted

## 2021-02-05 MED ORDER — FLUCONAZOLE 150 MG PO TABS: ORAL_TABLET | ORAL | 0 refills | Status: DC

## 2021-02-05 NOTE — Telephone Encounter (Signed)
Pt having white vaginal discharge. Diflucan 150mg  #2 one po 3 days apart per protocol and pt notified to call back if not better after treatment

## 2021-02-05 NOTE — Telephone Encounter (Signed)
Pt has yeast infection and wants to see some thing could be called in. I have explained to the Pt that she will need to make appt.   Pt call back [phone]

## 2021-02-09 ENCOUNTER — Other Ambulatory Visit: Payer: Self-pay | Admitting: Family Medicine

## 2021-02-09 NOTE — Telephone Encounter (Signed)
Last seen nov and was suppose to follow up in 3 months. Does have upcoming appt this month

## 2021-02-14 ENCOUNTER — Other Ambulatory Visit: Payer: Self-pay

## 2021-02-14 ENCOUNTER — Encounter: Payer: Self-pay | Admitting: Family Medicine

## 2021-02-14 ENCOUNTER — Ambulatory Visit: Payer: BC Managed Care – PPO | Admitting: Family Medicine

## 2021-02-14 VITALS — BP 133/87 | HR 103 | Temp 97.5°F | Wt 162.2 lb

## 2021-02-14 DIAGNOSIS — F411 Generalized anxiety disorder: Secondary | ICD-10-CM

## 2021-02-14 MED ORDER — CLONAZEPAM 0.5 MG PO TABS: ORAL_TABLET | ORAL | 1 refills | Status: DC

## 2021-02-14 MED ORDER — FLUOXETINE HCL 20 MG PO CAPS: 60.0000 mg | ORAL_CAPSULE | Freq: Every day | ORAL | 1 refills | Status: DC

## 2021-02-14 NOTE — Progress Notes (Signed)
   Subjective:    Patient ID: Stacey Prince, female    DOB: [birth year], 25 y.o.   MRN: [id number]  HPI Pt here for follow up on meds. Pt is taking Prozac and Clonazepam prn. Pt states doing well. No issues with meds.  Patient states her moods overall are doing well Denies being depressed Mild anxious and worrying this comes and goes some days worse than others rarely has to utilize nerve medication.  Denies being depressed currently.  Review of Systems     Objective:   Physical Exam  Lungs clear heart regular blood pressure good      Assessment & Plan:  1. 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

## 2021-05-03 ENCOUNTER — Other Ambulatory Visit: Payer: Self-pay | Admitting: Family Medicine

## 2021-05-03 MED ORDER — FLUOXETINE HCL 20 MG PO CAPS: 60.0000 mg | ORAL_CAPSULE | Freq: Every day | ORAL | 4 refills | Status: DC

## 2021-05-03 NOTE — Telephone Encounter (Signed)
Patient is requesting refill on Prozac 20 mg last filled 02/14/21 and patient last seen 02/14/21. [facility] pharmacy

## 2021-07-14 ENCOUNTER — Encounter: Payer: Self-pay | Admitting: Nurse Practitioner

## 2021-07-14 ENCOUNTER — Encounter: Payer: Self-pay | Admitting: Family Medicine

## 2021-07-16 MED ORDER — FLUCONAZOLE 150 MG PO TABS: ORAL_TABLET | ORAL | 1 refills | Status: DC

## 2021-07-28 ENCOUNTER — Ambulatory Visit
Admission: EM | Admit: 2021-07-28 | Discharge: 2021-07-28 | Disposition: A | Discharge status: Home / Self Care | Payer: BC Managed Care – PPO | Attending: Family Medicine | Admitting: Family Medicine

## 2021-07-28 ENCOUNTER — Other Ambulatory Visit: Payer: Self-pay

## 2021-07-28 ENCOUNTER — Encounter: Payer: Self-pay | Admitting: Emergency Medicine

## 2021-07-28 DIAGNOSIS — J069 Acute upper respiratory infection, unspecified: Secondary | ICD-10-CM

## 2021-07-28 DIAGNOSIS — R051 Acute cough: Secondary | ICD-10-CM

## 2021-07-28 MED ORDER — PROMETHAZINE-DM 6.25-15 MG/5ML PO SYRP: 5.0000 mL | ORAL_SOLUTION | Freq: Four times a day (QID) | ORAL | 0 refills | Status: DC | PRN

## 2021-07-28 NOTE — ED Triage Notes (Signed)
Pt is present today with cough, congestion, and body aches. Pt states sx started Wednesday

## 2021-07-29 LAB — COVID-19, FLU A+B NAA
Influenza A, NAA: DETECTED — AB
Influenza B, NAA: NOT DETECTED
SARS-CoV-2, NAA: NOT DETECTED

## 2021-07-30 ENCOUNTER — Telehealth: Payer: Self-pay

## 2021-07-30 ENCOUNTER — Ambulatory Visit: Payer: BC Managed Care – PPO | Admitting: Family Medicine

## 2021-07-30 NOTE — Telephone Encounter (Signed)
Pt contacted. Pt is having congestion, cough, chest hurts, back hurts, chest feels heavy, body aches and chills. Cough began Thursday night but did not start to feel until Friday. Cough syrup prescribed but was unable to pick up meds due to pharmacy was already closed. Pt has been using Mucinex. Sometimes has shortness of breath, but no trouble breathing if she does not exert herself. Please advise. Thank you

## 2021-07-30 NOTE — Telephone Encounter (Signed)
Pt contacted and placed on schedule for 3:30 today

## 2021-07-30 NOTE — Telephone Encounter (Signed)
Cannot call in antibiotics sight unseen May work in, patient to come in at 3:50 side door entrance we will work in before the end of the day

## 2021-07-30 NOTE — ED Provider Notes (Signed)
[facility]   [id number] 07/28/21 Arrival Time: 1139  ASSESSMENT & PLAN:  1. Viral URI with cough   2. Acute cough    Discussed typical duration of viral illnesses. Viral testing sent. OTC symptom care as needed.  Meds ordered this encounter  Medications   promethazine-dextromethorphan (PROMETHAZINE-DM) 6.25-15 MG/5ML syrup    Sig: Take 5 mLs by mouth 4 (four) times daily as needed for cough.    Dispense:  118 mL    Refill:  0     Follow-up Information     Luking, Jonna Coup, MD.   Specialty: Family Medicine Why: If worsening or failing to improve as anticipated. Contact information: [address] [place] [address] [phone]                 Reviewed expectations re: course of current medical issues. Questions answered. Outlined signs and symptoms indicating need for more acute intervention. Understanding verbalized. After Visit Summary given.   SUBJECTIVE: History from: patient. Stacey Prince is a 25 y.o. female who reports: body aches, cough, congestion; x few days. Denies: fever and difficulty breathing. Normal PO intake without n/v/d.   OBJECTIVE:  Vitals:   07/28/21 1331  BP: 138/87  Pulse: (!) 117  Resp: 18  Temp: 99.5 F (37.5 C)  SpO2: 98%    General appearance: alert; no distress Eyes: PERRLA; EOMI; conjunctiva normal HENT: Roy; AT; with nasal congestion Neck: supple  Lungs: speaks full sentences without difficulty; unlabored; clear Extremities: no edema Skin: warm and dry Neurologic: normal gait Psychological: alert and cooperative; normal mood and affect  Labs: Results for orders placed or performed during the hospital encounter of 07/28/21  Covid-19, Flu A+B ([facility])   Specimen: Nasopharyngeal   Naso  Result Value Ref Range   SARS-CoV-2, NAA Not Detected Not Detected   Influenza A, NAA Detected (A) Not Detected   Influenza B, NAA Not Detected Not Detected   Test Information: Comment     Labs Reviewed  COVID-19, FLU A+B NAA - Abnormal; Notable for the following components:      Result Value   Influenza A, NAA Detected (*)    All other components within normal limits   Narrative:    Performed at:  [address] [employer] [address] [place], [place]  [id number] Lab Director: Jolene Schimke MD, Phone:  [phone]    Imaging: No results found.  Allergies  Allergen Reactions   Amoxicillin    Zoloft [Sertraline] Other (See Comments)    Flat affect on low dose    History reviewed. No pertinent past medical history. Social History   Socioeconomic History   Marital status: Single    Spouse name: Not on file   Number of children: Not on file   Years of education: Not on file   Highest education level: Not on file  Occupational History   Not on file  Tobacco Use   Smoking status: Never   Smokeless tobacco: Never  Substance and Sexual Activity   Alcohol use: Not on file   Drug use: Not on file   Sexual activity: Yes    Birth control/protection: Pill  Other Topics Concern   Not on file  Social History Narrative   Not on file   Social Determinants of Health   Financial Resource Strain: Not on file  Food Insecurity: Not on file  Transportation Needs: Not on file  Physical Activity: Not on file  Stress: Not on file  Social Connections:  Not on file  Intimate Partner Violence: Not on file   Family History  Problem Relation Age of Onset   Healthy Mother    Healthy Father    History reviewed. No pertinent surgical history.   Mardella Layman, MD 07/30/21 [phone]

## 2021-07-30 NOTE — Telephone Encounter (Signed)
Patient was seen at urgent care 11/4 and has the flu they did not give her tamiflu she is requesting a antibiotic ph# [phone]

## 2021-07-31 ENCOUNTER — Encounter: Payer: Self-pay | Admitting: Family Medicine

## 2021-08-30 ENCOUNTER — Other Ambulatory Visit: Payer: Self-pay | Admitting: Family Medicine

## 2021-09-21 ENCOUNTER — Other Ambulatory Visit: Payer: Self-pay

## 2021-09-21 ENCOUNTER — Telehealth: Payer: BC Managed Care – PPO | Admitting: Nurse Practitioner

## 2021-09-28 ENCOUNTER — Telehealth: Payer: BC Managed Care – PPO | Admitting: Emergency Medicine

## 2021-09-28 DIAGNOSIS — J039 Acute tonsillitis, unspecified: Secondary | ICD-10-CM

## 2021-09-28 MED ORDER — CLINDAMYCIN HCL 300 MG PO CAPS: 300.0000 mg | ORAL_CAPSULE | Freq: Three times a day (TID) | ORAL | 0 refills | Status: DC

## 2021-09-28 NOTE — Progress Notes (Signed)
E-Visit for Sore Throat - Tonsillitis   It sounds like you may have tonsillitis.  This can be caused by strep.  We will treat you with Clindamycin antibiotic.  I've sent this to  your pharmacy.  Take as directed.  For throat pain, we recommend over the counter oral pain relief medications such as acetaminophen or aspirin, or anti-inflammatory medications such as ibuprofen or naproxen sodium. Topical treatments such as oral throat lozenges or sprays may be used as needed. Throat infections are not as easily transmitted as other respiratory infections, however we still recommend that you avoid close contact with loved ones, especially the very young and elderly.  Remember to wash your hands thoroughly throughout the day as this is the number one way to prevent the spread of infection and wipe down door knobs and counters with disinfectant.   Home Care: Only take medications as instructed by your medical team. Complete the entire course of an antibiotic. Do not take these medications with alcohol. A steam or ultrasonic humidifier can help congestion.  You can place a towel over your head and breathe in the steam from hot water coming from a faucet. Avoid close contacts especially the very young and the elderly. Cover your mouth when you cough or sneeze. Always remember to wash your hands.  Get Help Right Away If: You develop worsening fever or sinus pain. You develop a severe head ache or visual changes. Your symptoms persist after you have completed your treatment plan.  Make sure you Understand these instructions. Will watch your condition. Will get help right away if you are not doing well or get worse.   Thank you for choosing an e-visit.  Your e-visit answers were reviewed by a board certified advanced clinical practitioner to complete your personal care plan. Depending upon the condition, your plan could have included both over the counter or prescription medications.  Please  review your pharmacy choice. Make sure the pharmacy is open so you can pick up prescription now. If there is a problem, you may contact your provider through [employer] and have the prescription routed to another pharmacy.  Your safety is important to [place]. If you have drug allergies check your prescription carefully.   For the next 24 hours you can use MyChart to ask questions about today's visit, request a non-urgent call back, or ask for a work or school excuse. You will get an email in the next two days asking about your experience. I hope that your e-visit has been valuable and will speed your recovery.

## 2021-10-02 ENCOUNTER — Ambulatory Visit: Payer: BC Managed Care – PPO | Admitting: Family Medicine

## 2021-10-02 ENCOUNTER — Encounter: Payer: Self-pay | Admitting: Family Medicine

## 2021-10-02 ENCOUNTER — Other Ambulatory Visit: Payer: Self-pay

## 2021-10-02 VITALS — BP 122/84 | HR 93 | Temp 98.0°F | Wt 169.2 lb

## 2021-10-02 DIAGNOSIS — H9202 Otalgia, left ear: Secondary | ICD-10-CM | POA: Diagnosis not present

## 2021-10-02 DIAGNOSIS — F418 Other specified anxiety disorders: Secondary | ICD-10-CM

## 2021-10-02 MED ORDER — FLUOXETINE HCL 20 MG PO CAPS: 60.0000 mg | ORAL_CAPSULE | Freq: Every day | ORAL | 5 refills | Status: DC

## 2021-10-02 NOTE — Patient Instructions (Signed)
If having ongoing troubles let us know otherwise please follow-up in approximately 6 months

## 2021-10-02 NOTE — Progress Notes (Signed)
° °  Subjective:    Patient ID: Stacey Prince, female    DOB: [birth year], 26 y.o.   MRN: [id number]  HPI Pt having left ear pain. Pt did e-visit last week and diagnosed with tonsillitis. Pt states tonsillitis and throat issues gone but still having pain in ear. Pt currently taking Clindamycin for tonsillitis. No fever  She denies any high fevers wheezing difficulty breathing sinus pain  Her moods overall are doing well she relates anxiousness is doing well denies any panic attacks not having to use any nerve pills recently does state the Prozac is doing well and she would like to have refills denies being depressed Review of Systems     Objective:   Physical Exam  Eardrums are normal bilateral throat is normal tonsils are noted but not inflamed lungs are clear heart is regular      Assessment & Plan:  Depression under good control Anxiousness under good control Continue Prozac 3 capsules daily Follow-up within 6 months Utilize clonazepam sparingly caution drowsiness  Ear pain-referred pain from the throat no intervention necessary currently

## 2021-10-08 ENCOUNTER — Telehealth: Payer: Self-pay | Admitting: Family Medicine

## 2021-10-08 ENCOUNTER — Encounter: Payer: Self-pay | Admitting: Family Medicine

## 2021-10-08 ENCOUNTER — Telehealth: Payer: BC Managed Care – PPO | Admitting: Physician Assistant

## 2021-10-08 DIAGNOSIS — B379 Candidiasis, unspecified: Secondary | ICD-10-CM

## 2021-10-08 DIAGNOSIS — T3695XA Adverse effect of unspecified systemic antibiotic, initial encounter: Secondary | ICD-10-CM | POA: Diagnosis not present

## 2021-10-08 MED ORDER — FLUCONAZOLE 150 MG PO TABS: 150.0000 mg | ORAL_TABLET | Freq: Once | ORAL | 0 refills | Status: AC

## 2021-10-08 MED ORDER — FLUCONAZOLE 150 MG PO TABS: ORAL_TABLET | ORAL | 0 refills | Status: DC

## 2021-10-08 NOTE — Progress Notes (Signed)

## 2021-10-08 NOTE — Telephone Encounter (Signed)
Pt replied via my chart; Diflucan sent in per protocol and pt is aware

## 2021-10-08 NOTE — Telephone Encounter (Signed)
Patient was seen on 1/10 and given antibiotic and now has yeast infection . She is requesting a prescription called into [facility]

## 2021-10-08 NOTE — Telephone Encounter (Signed)
Pt sent my chart message also.

## 2021-11-05 ENCOUNTER — Other Ambulatory Visit: Payer: Self-pay | Admitting: Family Medicine

## 2021-11-05 MED ORDER — FLUCONAZOLE 150 MG PO TABS: ORAL_TABLET | ORAL | 0 refills | Status: DC

## 2021-11-06 ENCOUNTER — Other Ambulatory Visit: Payer: Self-pay | Admitting: Nurse Practitioner

## 2021-11-07 ENCOUNTER — Encounter: Payer: Self-pay | Admitting: Family Medicine

## 2021-11-09 ENCOUNTER — Other Ambulatory Visit: Payer: Self-pay

## 2021-11-09 ENCOUNTER — Ambulatory Visit: Payer: BC Managed Care – PPO | Admitting: Nurse Practitioner

## 2021-11-09 VITALS — BP 130/84 | HR 114 | Temp 97.7°F | Ht 64.5 in | Wt 170.0 lb

## 2021-11-09 DIAGNOSIS — B3731 Acute candidiasis of vulva and vagina: Secondary | ICD-10-CM

## 2021-11-09 MED ORDER — FLUCONAZOLE 150 MG PO TABS: ORAL_TABLET | ORAL | 2 refills | Status: DC

## 2021-11-09 NOTE — Progress Notes (Signed)
° °  Subjective:    Patient ID: Stacey Prince, female    DOB: Oct 20, 1995, 26 y.o.   MRN: [id number]  HPI Presents for complaints of frequent recurrent vaginal yeast infections.  States she has had problems since high school.  Usually associated with illness, antibiotic use, wet bathing suit, and sometimes with her cycle.  Describes itching and redness in the vaginal area.  Does not usually see any specific discharge.  Uncomfortable during intercourse during that time.  No pelvic pain.  Married, same sexual partner.  Defers need for STD testing.  Most recently treated on 11/05/2021 with Diflucan, symptoms resolved after the second dose.  Has had some infections and viruses several times over the past few months.  No history of any immune system issues.  Last labs 01/25/2021 were normal.       Objective:   Physical Exam NAD.  Alert, oriented.  Lungs clear.  Heart regular rate rhythm.  Abdomen mildly obese soft nondistended nontender. Today's Vitals   11/09/21 0905  BP: 130/84  Pulse: (!) 114  Temp: 97.7 F (36.5 C)  SpO2: 97%  Weight: 170 lb (77.1 kg)  Height: 5' 4.5" (1.638 m)   Body mass index is 28.73 kg/m.        Assessment & Plan:   Problem List Items Addressed This Visit       Genitourinary   Recurrent candidiasis of vagina - Primary   Relevant Medications   fluconazole (DIFLUCAN) 150 MG tablet   Meds ordered this encounter  Medications   fluconazole (DIFLUCAN) 150 MG tablet    Sig: One po qd prn yeast infection; may repeat in 3-4 days if needed    Dispense:  2 tablet    Refill:  2    Order Specific Question:   Supervising Provider    Answer:   Lilyan Punt A [[id number]]   Discussed options.  We will start with use of Diflucan as needed for vaginal yeast infection.  As long as he is using this occasionally will continue with this regimen.  Start daily acidophilus as directed.  Given written and verbal information on preventing yeast vaginitis.  If she has frequent  yeast infections or if it does not clear up with the use of 2 doses of Diflucan, patient will contact the office.  Agrees with this plan. Return if symptoms worsen or fail to improve.

## 2021-11-09 NOTE — Patient Instructions (Signed)
Vaginal Yeast Infection, Adult °Vaginal yeast infection is a condition that causes vaginal discharge as well as soreness, swelling, and redness (inflammation) of the vagina. This is a common condition. Some women get this infection frequently. °What are the causes? °This condition is caused by a change in the normal balance of the yeast (Candida) and normal bacteria that live in the vagina. This change causes an overgrowth of yeast, which causes the inflammation. °What increases the risk? °The condition is more likely to develop in women who: °Take antibiotic medicines. °Have diabetes. °Take birth control pills. °Are pregnant. °Douche often. °Have a weak body defense system (immune system). °Have been taking steroid medicines for a long time. °Frequently wear tight clothing. °What are the signs or symptoms? °Symptoms of this condition include: °White, thick, creamy vaginal discharge. °Swelling, itching, redness, and irritation of the vagina. The lips of the vagina (labia) may be affected as well. °Pain or a burning feeling while urinating. °Pain during sex. °How is this diagnosed? °This condition is diagnosed based on: °Your medical history. °A physical exam. °A pelvic exam. Your health care provider will examine a sample of your vaginal discharge under a microscope. Your health care provider may send this sample for testing to confirm the diagnosis. °How is this treated? °This condition is treated with medicine. Medicines may be over-the-counter or prescription. You may be told to use one or more of the following: °Medicine that is taken by mouth (orally). °Medicine that is applied as a cream (topically). °Medicine that is inserted directly into the vagina (suppository). °Follow these instructions at home: °Take or apply over-the-counter and prescription medicines only as told by your health care provider. °Do not use tampons until your health care provider approves. °Do not have sex until your infection has  cleared. Sex can prolong or worsen your symptoms of infection. Ask your health care provider when it is safe to resume sexual activity. °Keep all follow-up visits. This is important. °How is this prevented? ° °Do not wear tight clothes, such as pantyhose or tight pants. °Wear breathable cotton underwear. °Do not use douches, perfumed soap, creams, or powders. °Wipe from front to back after using the toilet. °If you have diabetes, keep your blood sugar levels under control. °Ask your health care provider for other ways to prevent yeast infections. °Contact a health care provider if: °You have a fever. °Your symptoms go away and then return. °Your symptoms do not get better with treatment. °Your symptoms get worse. °You have new symptoms. °You develop blisters in or around your vagina. °You have blood coming from your vagina and it is not your menstrual period. °You develop pain in your abdomen. °Summary °Vaginal yeast infection is a condition that causes discharge as well as soreness, swelling, and redness (inflammation) of the vagina. °This condition is treated with medicine. Medicines may be over-the-counter or prescription. °Take or apply over-the-counter and prescription medicines only as told by your health care provider. °Do not douche. Resume sexual activity or use of tampons as instructed by your health care provider. °Contact a health care provider if your symptoms do not get better with treatment or your symptoms go away and then return. °This information is not intended to replace advice given to you by your health care provider. Make sure you discuss any questions you have with your health care provider. °Document Revised: 11/27/2020 Document Reviewed: 11/27/2020 °Elsevier Patient Education © 2022 [employer] Inc. ° °

## 2021-11-10 ENCOUNTER — Encounter: Payer: Self-pay | Admitting: Nurse Practitioner

## 2021-12-18 ENCOUNTER — Ambulatory Visit
Admission: EM | Admit: 2021-12-18 | Discharge: 2021-12-18 | Disposition: A | Discharge status: Home / Self Care | Payer: BC Managed Care – PPO | Attending: Urgent Care | Admitting: Urgent Care

## 2021-12-18 ENCOUNTER — Telehealth: Payer: BC Managed Care – PPO | Admitting: Physician Assistant

## 2021-12-18 ENCOUNTER — Other Ambulatory Visit: Payer: Self-pay

## 2021-12-18 DIAGNOSIS — R079 Chest pain, unspecified: Secondary | ICD-10-CM

## 2021-12-18 DIAGNOSIS — M940 Chondrocostal junction syndrome [Tietze]: Secondary | ICD-10-CM | POA: Diagnosis not present

## 2021-12-18 DIAGNOSIS — R0789 Other chest pain: Secondary | ICD-10-CM

## 2021-12-18 MED ORDER — TIZANIDINE HCL 4 MG PO TABS
4.0000 mg | ORAL_TABLET | Freq: Every day | ORAL | 0 refills | Status: DC
Start: 2021-12-18 — End: 2023-04-11

## 2021-12-18 MED ORDER — NAPROXEN 500 MG PO TABS: 500.0000 mg | ORAL_TABLET | Freq: Two times a day (BID) | ORAL | 0 refills | Status: DC

## 2021-12-18 NOTE — ED Provider Notes (Signed)
?[facility] ? ? ?MRN: [id number] DOB: 06-29-1996 ? ?Subjective:  ? ?Stacey Prince is a 26 y.o. female presenting for acute onset of mid to left-sided chest wall pain worse with taking a deep breath, using the chest muscles and moving.  No fall, trauma, fever, runny or stuffy nose, sore throat, cough, shortness of breath, wheezing, nausea, vomiting, abdominal pain, diaphoresis.  No history of respiratory disorders.  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  She also works out doing [employer]  No history of arrhythmias, heart conditions. ? ?No current facility-administered medications for this encounter. ? ?Current Outpatient Medications:  ?  clonazePAM (KLONOPIN) 0.5 MG tablet, TAKE 1/2 TO 1 TABLET TWICE DAILY AS NEEDED FOR AGITATION., Disp: 20 tablet, Rfl: 0 ?  fexofenadine (ALLEGRA) 180 MG tablet, Take 180 mg by mouth daily., Disp: , Rfl:  ?  fluconazole (DIFLUCAN) 150 MG tablet, One po qd prn yeast infection; may repeat in 3-4 days if needed, Disp: 2 tablet, Rfl: 2 ?  FLUoxetine (PROZAC) 20 MG capsule, Take 3 capsules (60 mg total) by mouth daily., Disp: 90 capsule, Rfl: 5 ?  fluticasone (FLONASE) 50 MCG/ACT nasal spray, Place 1 spray into both nostrils daily for 14 days., Disp: 16 g, Rfl: 0 ?  norethindrone-ethinyl estradiol 1/35 (ALAYCEN 1/35) tablet, Take 1 tablet by mouth daily., Disp: 30 tablet, Rfl: 11  ? ?Allergies  ?Allergen Reactions  ? Penicillins Rash  ? Amoxicillin   ? Zoloft [Sertraline] Other (See Comments)  ?  Flat affect on low dose  ? ? ?History reviewed. No pertinent past medical history.  ? ?History reviewed. No pertinent surgical history. ? ?Family History  ?Problem Relation Age of Onset  ? Healthy Mother   ? Healthy Father   ? ? ?Social History  ? ?Tobacco Use  ? Smoking status: Never  ? Smokeless tobacco: Never  ?Substance Use Topics  ? Alcohol use: Yes  ? Drug use: Never  ? ? ?ROS ? ? ?Objective:   ? ?Vitals: ?BP 129/86 (BP Location: Right Arm)   Pulse 90   Temp 98.8 ?F (37.1 ?C) (Oral)   Resp 20   SpO2 98%  ? ?Physical Exam ?Constitutional:   ?   General: She is not in acute distress. ?   Appearance: Normal appearance. She is well-developed. She is not ill-appearing, toxic-appearing or diaphoretic.  ?HENT:  ?   Head: Normocephalic and atraumatic.  ?   Nose: Nose normal.  ?   Mouth/Throat:  ?   Mouth: Mucous membranes are moist.  ?Eyes:  ?   General: No scleral icterus.    ?   Right eye: No discharge.     ?   Left eye: No discharge.  ?   Extraocular Movements: Extraocular movements intact.  ?Cardiovascular:  ?   Rate and Rhythm: Normal rate.  ?   Heart sounds: No murmur heard. ?  No friction rub. No gallop.  ?Pulmonary:  ?   Effort: Pulmonary effort is normal. No respiratory distress.  ?   Breath sounds: No stridor. No wheezing, rhonchi or rales.  ?Chest:  ?   Chest wall: Tenderness (focal and reproducible) present. No deformity, swelling, crepitus or edema.  ? ? ?Skin: ?   General: Skin is warm and dry.  ?Neurological:  ?   General: No focal deficit present.  ?   Mental Status: She is alert and oriented to person, place, and time.  ?Psychiatric:     ?  Mood and Affect: Mood normal.     ?   Behavior: Behavior normal.  ? ? ?Assessment and Plan :  ? ?PDMP not reviewed this encounter. ? ?1. Costochondritis   ?2. Chest wall pain   ? ?Physical exam findings consistent with costochondritis, chest wall strain which is supported by the nature of her work and weight lifting.  Recommended conservative management with naproxen, tizanidine and rest. Counseled patient on potential for adverse effects with medications prescribed/recommended today, ER and return-to-clinic precautions discussed, patient verbalized understanding. ? ?  ?Wallis Bamberg, PA-C ?12/18/21 1412 ? ?

## 2021-12-18 NOTE — ED Triage Notes (Signed)
Pt reports left sided chest since this morning. States pain is worse when moving and taking a deep breath. Advil  and Klonopin gives no relief. Denies shortness of breath, headache, dizziness, vision changes, abdominal pain, nausea, vomiting.,  ?

## 2021-12-18 NOTE — Progress Notes (Signed)
Based on what you shared with me, I feel your condition warrants further evaluation and I recommend that you be seen in a face to face visit. ? ?This is not something we can evaluate via virtual visit and requires an in-person assessment. I would call your PCP or be seen at local Urgent Care ASAP -- see below. If anything is worsening before assessment, you need an ER evaluation.  ?  ?NOTE: There will be NO CHARGE for this eVisit ?  ?If you are having a true medical emergency please call 911.   ?  ? For an urgent face to face visit, [place] has six urgent care centers for your convenience:  ?  ? [facility] at [facility] ?Get Driving Directions ?[phone] ?[phone] ?[place], [place] [address] ?  ? [facility]) ?Get Driving Directions ?[phone] ?[address] ?[place], [place] [address] ? ?[facility]) ?Get Driving Directions ?[phone] ?[address] ?[place],  [place]  [address] ? ?[facility] ?Get Driving Directions ?[phone] ?[address] ?[place], [place] [address] ?  ?[facility] [place] ?Get Driving Directions  ?[phone] ?[address] ?[address] ?[place], [place] [address] ?  ?[facility] ?Get Driving Directions ?[phone] ?[address] ?[place], [place] [address] ? ?Your MyChart E-visit questionnaire answers were reviewed by a board certified advanced clinical practitioner to complete your personal care plan based on your specific symptoms.  Thank you for using e-Visits. ?  ? ?

## 2022-01-04 ENCOUNTER — Other Ambulatory Visit: Payer: Self-pay | Admitting: Family Medicine

## 2022-01-05 ENCOUNTER — Telehealth: Payer: BC Managed Care – PPO | Admitting: Physician Assistant

## 2022-01-05 ENCOUNTER — Encounter: Payer: Self-pay | Admitting: Physician Assistant

## 2022-01-05 DIAGNOSIS — B3731 Acute candidiasis of vulva and vagina: Secondary | ICD-10-CM

## 2022-01-05 MED ORDER — FLUCONAZOLE 150 MG PO TABS: 150.0000 mg | ORAL_TABLET | Freq: Once | ORAL | 0 refills | Status: AC

## 2022-01-05 NOTE — Progress Notes (Signed)
E-Visit for Vaginal Symptoms ? ?We are sorry that you are not feeling well. Here is how we plan to help! ?Based on what you shared with me it looks like you: May have a yeast vaginosis ? ?Vaginosis is an inflammation of the vagina that can result in discharge, itching and pain. The cause is usually a change in the normal balance of vaginal bacteria or an infection. Vaginosis can also result from reduced estrogen levels after menopause. ? ?The most common causes of vaginosis are: ? ? Bacterial vaginosis which results from an overgrowth of one on several organisms that are normally present in your vagina. ? ? Yeast infections which are caused by a naturally occurring fungus called candida. ? ? Vaginal atrophy (atrophic vaginosis) which results from the thinning of the vagina from reduced estrogen levels after menopause. ? ? Trichomoniasis which is caused by a parasite and is commonly transmitted by sexual intercourse. ? ?Factors that increase your risk of developing vaginosis include: ?Medications, such as antibiotics and steroids ?Uncontrolled diabetes ?Use of hygiene products such as bubble bath, vaginal spray or vaginal deodorant ?Douching ?Wearing damp or tight-fitting clothing ?Using an intrauterine device (IUD) for birth control ?Hormonal changes, such as those associated with pregnancy, birth control pills or menopause ?Sexual activity ?Having a sexually transmitted infection ? ?Your treatment plan is A single Diflucan (fluconazole) 150mg tablet once.  I have electronically sent this prescription into the pharmacy that you have chosen. ? ?Be sure to take all of the medication as directed. Stop taking any medication if you develop a rash, tongue swelling or shortness of breath. Mothers who are breast feeding should consider pumping and discarding their breast milk while on these antibiotics. However, there is no consensus that infant exposure at these doses would be harmful.  ?Remember that medication creams can  weaken latex condoms. ?. ? ? ?HOME CARE: ? ?Good hygiene may prevent some types of vaginosis from recurring and may relieve some symptoms: ? ?Avoid baths, hot tubs and whirlpool spas. Rinse soap from your outer genital area after a shower, and dry the area well to prevent irritation. Don't use scented or harsh soaps, such as those with deodorant or antibacterial action. ?Avoid irritants. These include scented tampons and pads. ?Wipe from front to back after using the toilet. Doing so avoids spreading fecal bacteria to your vagina. ? ?Other things that may help prevent vaginosis include: ? ?Don't douche. Your vagina doesn't require cleansing other than normal bathing. Repetitive douching disrupts the normal organisms that reside in the vagina and can actually increase your risk of vaginal infection. Douching won't clear up a vaginal infection. ?Use a latex condom. Both female and female latex condoms may help you avoid infections spread by sexual contact. ?Wear cotton underwear. Also wear pantyhose with a cotton crotch. If you feel comfortable without it, skip wearing underwear to bed. Yeast thrives in moist environments ?Your symptoms should improve in the next day or two. ? ?GET HELP RIGHT AWAY IF: ? ?You have pain in your lower abdomen ( pelvic area or over your ovaries) ?You develop nausea or vomiting ?You develop a fever ?Your discharge changes or worsens ?You have persistent pain with intercourse ?You develop shortness of breath, a rapid pulse, or you faint. ? ?These symptoms could be signs of problems or infections that need to be evaluated by a medical provider now. ? ?MAKE SURE YOU  ? ?Understand these instructions. ?Will watch your condition. ?Will get help right away if you are not   doing well or get worse.  Thank you for choosing an e-visit.  Your e-visit answers were reviewed by a board certified advanced clinical practitioner to complete your personal care plan. Depending upon the condition, your plan  could have included both over the counter or prescription medications.  Please review your pharmacy choice. Make sure the pharmacy is open so you can pick up prescription now. If there is a problem, you may contact your provider through MyChart messaging and have the prescription routed to another pharmacy.  Your safety is important to us. If you have drug allergies check your prescription carefully.   For the next 24 hours you can use MyChart to ask questions about today's visit, request a non-urgent call back, or ask for a work or school excuse. You will get an email in the next two days asking about your experience. I hope that your e-visit has been valuable and will speed your recovery.  I have spent 5 minutes in review of e-visit questionnaire, review and updating patient chart, medical decision making and response to patient.   Cloris Flippo S Mayers, PA-C     

## 2022-01-08 ENCOUNTER — Telehealth: Payer: BC Managed Care – PPO | Admitting: Family

## 2022-01-08 DIAGNOSIS — N898 Other specified noninflammatory disorders of vagina: Secondary | ICD-10-CM

## 2022-01-08 NOTE — Progress Notes (Signed)
Based on what you shared with me, I feel your condition warrants further evaluation and I recommend that you be seen in a face to face visit. ?  ?I am sorry, but Evisits are for acute issues. Given this is recurrent, you need to be seen in person.  ? ? ?NOTE: There will be NO CHARGE for this eVisit ?  ?If you are having a true medical emergency please call 911.   ?  ? For an urgent face to face visit, [place] has six urgent care centers for your convenience:  ?  ? [facility] ?Get Driving Directions ?[phone] ?[phone] ?[place], [place] [address] ?  ? [facility]) ?Get Driving Directions ?[phone] ?[address] ?[place], [place] [address] ? ?[facility]) ?Get Driving Directions ?[phone] ?[address] ?[place],  [place]  [address] ? ?[facility] ?Get Driving Directions ?[phone] ?[address] ?[place], [place] [address] ?  ?[facility] [place] ?Get Driving Directions  ?[phone] ?[address] ?[address] ?[place], [place] [address] ?  ?[facility] ?Get Driving Directions ?[phone] ?[address] ?[place], [place] [address] ? ?Your MyChart E-visit questionnaire answers were reviewed by a board certified advanced clinical practitioner to complete your personal care plan based on your specific symptoms.  Thank you for using e-Visits. ?  ? ?

## 2022-02-25 ENCOUNTER — Telehealth: Payer: BC Managed Care – PPO | Admitting: Physician Assistant

## 2022-02-25 DIAGNOSIS — N760 Acute vaginitis: Secondary | ICD-10-CM

## 2022-02-25 MED ORDER — FLUCONAZOLE 150 MG PO TABS: ORAL_TABLET | ORAL | 0 refills | Status: DC

## 2022-02-25 NOTE — Progress Notes (Signed)

## 2022-03-25 ENCOUNTER — Other Ambulatory Visit: Payer: Self-pay | Admitting: Family Medicine

## 2022-06-04 ENCOUNTER — Other Ambulatory Visit: Payer: Self-pay | Admitting: Family Medicine

## 2022-06-05 NOTE — Telephone Encounter (Signed)
May have 30-day with 1 refill patient needs to do a standard follow-up visit within the next 60 days

## 2022-07-24 ENCOUNTER — Other Ambulatory Visit: Payer: Self-pay | Admitting: Family Medicine

## 2022-07-30 NOTE — Telephone Encounter (Signed)
Please contact pt to have her schedule appt. Thank you! 

## 2022-08-01 NOTE — Telephone Encounter (Signed)
Sent mychart message to appointment 08/01/22

## 2022-09-05 NOTE — Telephone Encounter (Signed)
Sent my chart message to schedule appointment-09/05/22

## 2022-09-12 LAB — OB RESULTS CONSOLE HEPATITIS B SURFACE ANTIGEN: Hepatitis B Surface Ag: NEGATIVE

## 2022-09-12 LAB — OB RESULTS CONSOLE RUBELLA ANTIBODY, IGM: Rubella: IMMUNE

## 2022-09-23 NOTE — L&D Delivery Note (Addendum)
Delivery Note At 6:26 PM a viable female was delivered via Vaginal, Spontaneous (Presentation: LOA).  APGAR: 5, 8; weight pending.   Placenta status: Spontaneous;Expressed, Intact.  Cord: 3 vessels with the following complications: None.  Cord pH: n/a  Anesthesia: Epidural Episiotomy: None Lacerations: 2nd degree Suture Repair: 3.0 vicryl rapide Est. Blood Loss (mL):  300  Mom to postpartum.  Baby to Couplet care / Skin to Skin.  Maternal T 100.0 after delivery.  Newborn T 102.  Will closely monitor for fever and treat prn.  Placenta sent to pathology.  Mitchel Honour 04/11/2023, 6:59 PM

## 2022-09-24 NOTE — Telephone Encounter (Signed)
Sent second requesting to schedule medication follow up 09/24/22

## 2022-09-25 NOTE — Telephone Encounter (Signed)
Spoke with patient and she stated she has moved to a new pcp and has plenty of medication to last until her appt in feb 2024.

## 2022-10-01 LAB — OB RESULTS CONSOLE GC/CHLAMYDIA
Chlamydia: NEGATIVE
Neisseria Gonorrhea: NEGATIVE

## 2022-10-02 ENCOUNTER — Inpatient Hospital Stay (HOSPITAL_COMMUNITY): Admit: 2022-10-02 | Payer: BC Managed Care – PPO | Admitting: Obstetrics and Gynecology

## 2022-12-11 ENCOUNTER — Telehealth: Payer: Self-pay

## 2022-12-11 ENCOUNTER — Other Ambulatory Visit: Payer: Self-pay | Admitting: Obstetrics and Gynecology

## 2022-12-11 DIAGNOSIS — R768 Other specified abnormal immunological findings in serum: Secondary | ICD-10-CM

## 2022-12-19 ENCOUNTER — Encounter: Payer: Self-pay | Admitting: *Deleted

## 2022-12-27 ENCOUNTER — Ambulatory Visit: Payer: BC Managed Care – PPO | Attending: Obstetrics and Gynecology

## 2022-12-27 ENCOUNTER — Encounter: Payer: Self-pay | Admitting: *Deleted

## 2022-12-27 ENCOUNTER — Other Ambulatory Visit: Payer: Self-pay | Admitting: *Deleted

## 2022-12-27 ENCOUNTER — Ambulatory Visit: Payer: BC Managed Care – PPO | Admitting: *Deleted

## 2022-12-27 ENCOUNTER — Ambulatory Visit (HOSPITAL_BASED_OUTPATIENT_CLINIC_OR_DEPARTMENT_OTHER): Payer: BC Managed Care – PPO | Admitting: Maternal & Fetal Medicine

## 2022-12-27 VITALS — BP 134/83 | HR 105

## 2022-12-27 DIAGNOSIS — O99891 Other specified diseases and conditions complicating pregnancy: Secondary | ICD-10-CM | POA: Diagnosis not present

## 2022-12-27 DIAGNOSIS — O99212 Obesity complicating pregnancy, second trimester: Secondary | ICD-10-CM | POA: Insufficient documentation

## 2022-12-27 DIAGNOSIS — Z362 Encounter for other antenatal screening follow-up: Secondary | ICD-10-CM

## 2022-12-27 DIAGNOSIS — R21 Rash and other nonspecific skin eruption: Secondary | ICD-10-CM | POA: Insufficient documentation

## 2022-12-27 DIAGNOSIS — O280 Abnormal hematological finding on antenatal screening of mother: Secondary | ICD-10-CM

## 2022-12-27 DIAGNOSIS — E669 Obesity, unspecified: Secondary | ICD-10-CM

## 2022-12-27 DIAGNOSIS — Z3A23 23 weeks gestation of pregnancy: Secondary | ICD-10-CM | POA: Insufficient documentation

## 2022-12-27 DIAGNOSIS — R768 Other specified abnormal immunological findings in serum: Secondary | ICD-10-CM

## 2022-12-27 NOTE — Progress Notes (Signed)
   Patient information  Patient Name: MILAAN SECRIST Heiney  Patient MRN:   [id number]  Referring practice: [facility] Referring Provider: [facility]  Medical/Obstetric History   Past pregnancies OB History  Gravida Para Term Preterm AB Living  1            SAB IAB Ectopic Multiple Live Births               # Outcome Date GA Lbr Len/2nd Weight Sex Delivery Anes PTL Lv  1 Current              Meighan MICHELLE Crevier is a 27 y.o. G1P0 at [redacted]w[redacted]d here for ultrasound and consultation.   RE anticentromere antibody, rash.  Liticia experienced a rash started during this pregnancy around her breast line and her legs.  Initially was thought to be a pregnancy related rash but she attempted multiple prescription steroids ointments and creams with little success.  She eventually tried a combination of cherry juice, tea tree oil and vitamin E that has cleared up the rash.  She had laboratory work testing for rheumatologic sources of the rash and was positive for an anticentromere antibody.  She is already seen dermatology and is going to now see rheumatology to follow-up with this antibody.  I discussed that there is really no data in pregnancy regarding the clinical utility of the single positive lab value outside of a clinical syndrome.  I did discuss that is associated with crest syndrome as well as Sjogren syndrome but will defer to rheumatology regarding the ultimate diagnosis and management of this specific test.  Review of Systems: A review of systems was performed and was negative except per HPI   Vitals and Physical Exam    12/27/2022    9:16 AM 12/18/2021    1:15 PM 11/09/2021    9:05 AM  Vitals with BMI  Height   5' 4.5"  Weight   170 lbs  BMI   28.74  Systolic 134 129 893  Diastolic 83 86 84  Pulse 105 90 114  Sitting comfortably on the sonogram table Nonlabored breathing Normal rate and rhythm Abdomen is nontender Resolving rash on the right thigh  Assessment - [redacted] weeks  gestation - Rash - Anti-centromere antibody Plan -Follow-up with her OB provider, rheumatology and dermatology as needed -Follow-up with growth and anatomy in 4 to 6 weeks   I spent 30 minutes reviewing the patients chart, including labs and images as well as counseling the patient about her medical conditions. Greater than 50% of the time was spent in direct face-to-face patient counseling.  Braxton Feathers  MFM, [facility]   12/27/2022  10:43 AM   Future Appointments   Future Appointments  Date Time Provider Department Center  12/27/2022 11:00 AM [facility] MD [facility]

## 2023-01-23 LAB — OB RESULTS CONSOLE ABO/RH: RH Type: NEGATIVE

## 2023-01-23 LAB — OB RESULTS CONSOLE HIV ANTIBODY (ROUTINE TESTING): HIV: NONREACTIVE

## 2023-01-29 ENCOUNTER — Ambulatory Visit: Payer: BC Managed Care – PPO

## 2023-04-01 LAB — OB RESULTS CONSOLE GBS: GBS: NEGATIVE

## 2023-04-10 ENCOUNTER — Encounter (HOSPITAL_COMMUNITY): Payer: Self-pay | Admitting: Obstetrics and Gynecology

## 2023-04-10 ENCOUNTER — Other Ambulatory Visit: Payer: Self-pay

## 2023-04-10 ENCOUNTER — Inpatient Hospital Stay (HOSPITAL_COMMUNITY)
Admission: AD | Admit: 2023-04-10 | Discharge: 2023-04-13 | Disposition: A | Discharge status: Home / Self Care | Payer: BC Managed Care – PPO | Attending: Obstetrics & Gynecology | Admitting: Obstetrics & Gynecology

## 2023-04-10 DIAGNOSIS — Z3A38 38 weeks gestation of pregnancy: Secondary | ICD-10-CM

## 2023-04-10 DIAGNOSIS — R030 Elevated blood-pressure reading, without diagnosis of hypertension: Secondary | ICD-10-CM | POA: Diagnosis present

## 2023-04-10 DIAGNOSIS — O864 Pyrexia of unknown origin following delivery: Secondary | ICD-10-CM | POA: Diagnosis not present

## 2023-04-10 DIAGNOSIS — O26893 Other specified pregnancy related conditions, third trimester: Secondary | ICD-10-CM | POA: Diagnosis present

## 2023-04-10 DIAGNOSIS — O133 Gestational [pregnancy-induced] hypertension without significant proteinuria, third trimester: Principal | ICD-10-CM

## 2023-04-10 DIAGNOSIS — O134 Gestational [pregnancy-induced] hypertension without significant proteinuria, complicating childbirth: Principal | ICD-10-CM | POA: Diagnosis present

## 2023-04-10 DIAGNOSIS — Z6791 Unspecified blood type, Rh negative: Secondary | ICD-10-CM | POA: Diagnosis not present

## 2023-04-10 DIAGNOSIS — O139 Gestational [pregnancy-induced] hypertension without significant proteinuria, unspecified trimester: Secondary | ICD-10-CM | POA: Diagnosis present

## 2023-04-10 LAB — COMPREHENSIVE METABOLIC PANEL
ALT: 27 U/L (ref 0–44)
AST: 37 U/L (ref 15–41)
Albumin: 3 g/dL — ABNORMAL LOW (ref 3.5–5.0)
Alkaline Phosphatase: 203 U/L — ABNORMAL HIGH (ref 38–126)
Anion gap: 10 (ref 5–15)
BUN: 8 mg/dL (ref 6–20)
CO2: 20 mmol/L — ABNORMAL LOW (ref 22–32)
Calcium: 9 mg/dL (ref 8.9–10.3)
Chloride: 102 mmol/L (ref 98–111)
Creatinine, Ser: 0.87 mg/dL (ref 0.44–1.00)
GFR, Estimated: >60 mL/min (ref >60)
Glucose, Bld: 99 mg/dL (ref 70–99)
Potassium: 3.6 mmol/L (ref 3.5–5.1)
Sodium: 132 mmol/L — ABNORMAL LOW (ref 135–145)
Total Bilirubin: 0.7 mg/dL (ref 0.3–1.2)
Total Protein: 6.1 g/dL — ABNORMAL LOW (ref 6.5–8.1)

## 2023-04-10 LAB — CBC
HCT: 35.9 % — ABNORMAL LOW (ref 36.0–46.0)
Hemoglobin: 12.4 g/dL (ref 12.0–15.0)
MCH: 32.5 pg (ref 26.0–34.0)
MCHC: 34.5 g/dL (ref 30.0–36.0)
MCV: 94.2 fL (ref 80.0–100.0)
Platelets: 249 10*3/uL (ref 150–400)
RBC: 3.81 MIL/uL — ABNORMAL LOW (ref 3.87–5.11)
RDW: 12.8 % (ref 11.5–15.5)
WBC: 8.9 10*3/uL (ref 4.0–10.5)
nRBC: 0 % (ref 0.0–0.2)

## 2023-04-10 LAB — TYPE AND SCREEN
ABO/RH(D): O NEG
Antibody Screen: POSITIVE

## 2023-04-10 LAB — PROTEIN / CREATININE RATIO, URINE
Creatinine, Urine: 172 mg/dL
Protein Creatinine Ratio: 0.07 mg/mg{Cre} (ref 0.00–0.15)
Total Protein, Urine: 12 mg/dL

## 2023-04-10 MED ORDER — OXYCODONE-ACETAMINOPHEN 5-325 MG PO TABS: 2.0000 | ORAL_TABLET | ORAL | Status: DC | PRN

## 2023-04-10 MED ORDER — ONDANSETRON HCL 4 MG/2ML IJ SOLN
4.0000 mg | Freq: Four times a day (QID) | INTRAMUSCULAR | Status: DC | PRN
  Administered 2023-04-11: 4 mg via INTRAVENOUS
  Filled 2023-04-10: qty 2

## 2023-04-10 MED ORDER — SOD CITRATE-CITRIC ACID 500-334 MG/5ML PO SOLN: 30.0000 mL | ORAL | Status: DC | PRN

## 2023-04-10 MED ORDER — LIDOCAINE HCL (PF) 1 % IJ SOLN: 30.0000 mL | INTRAMUSCULAR | Status: DC | PRN

## 2023-04-10 MED ORDER — OXYCODONE-ACETAMINOPHEN 5-325 MG PO TABS: 1.0000 | ORAL_TABLET | ORAL | Status: DC | PRN

## 2023-04-10 MED ORDER — OXYTOCIN-SODIUM CHLORIDE 30-0.9 UT/500ML-% IV SOLN
1.0000 m[IU]/min | INTRAVENOUS | Status: DC
  Administered 2023-04-10: 2 m[IU]/min via INTRAVENOUS
  Filled 2023-04-10: qty 500

## 2023-04-10 MED ORDER — LACTATED RINGERS IV SOLN: INTRAVENOUS | Status: DC

## 2023-04-10 MED ORDER — OXYTOCIN BOLUS FROM INFUSION: 333.0000 mL | Freq: Once | INTRAVENOUS | Status: DC

## 2023-04-10 MED ORDER — TERBUTALINE SULFATE 1 MG/ML IJ SOLN: 0.2500 mg | Freq: Once | INTRAMUSCULAR | Status: DC | PRN

## 2023-04-10 MED ORDER — OXYTOCIN-SODIUM CHLORIDE 30-0.9 UT/500ML-% IV SOLN: 2.5000 [IU]/h | INTRAVENOUS | Status: DC

## 2023-04-10 MED ORDER — ACETAMINOPHEN 325 MG PO TABS: 650.0000 mg | ORAL_TABLET | ORAL | Status: DC | PRN

## 2023-04-10 MED ORDER — FLUOXETINE HCL 20 MG PO CAPS
40.0000 mg | ORAL_CAPSULE | Freq: Two times a day (BID) | ORAL | Status: DC
  Administered 2023-04-10 – 2023-04-13 (×6): 40 mg via ORAL
  Filled 2023-04-10 (×8): qty 2

## 2023-04-10 MED ORDER — HYDROXYZINE HCL 50 MG PO TABS: 50.0000 mg | ORAL_TABLET | Freq: Four times a day (QID) | ORAL | Status: DC | PRN

## 2023-04-10 MED ORDER — LACTATED RINGERS IV SOLN: 500.0000 mL | INTRAVENOUS | Status: DC | PRN

## 2023-04-10 NOTE — MAU Note (Signed)
.  Stacey Prince is a 27 y.o. at [redacted]w[redacted]d here in MAU reporting: sent from office for elevated b/p and SVE 3. Pt reports fetal movement is less than usual.Deneis any headache or visual chages. OB office monitored the baby and told her it was not doing what they wanted  so Pt reported that they did an U/s of the baby and it was 'OK.  Denies any vag bleeding or leaking. Reports mild cts about 5 min apart.  LMP:  Onset of complaint: today Pain score: 2-3 Vitals:   04/10/23 1757  BP: (!) 143/89  Pulse: (!) 128  Resp: 18  Temp: 98 F (36.7 C)     FHT:135 Lab orders placed from triage:  PIH labs ordered

## 2023-04-10 NOTE — MAU Provider Note (Signed)
History     CSN: [id number]  Arrival date and time: 04/10/23 1723   Event Date/Time   First Provider Initiated Contact with Patient 04/10/23 1859      Chief Complaint  Patient presents with   Hypertension   Stacey Prince is a 27 y.o. year old G79P0000 female at 109w2d weeks gestation who presents to MAU reporting elevated BPs and DFM in the office today. She had She had a NST in the office and she was told the baby's HR "didn't go up like they wanted it to." She had a BPP in the office and was told "that was ok." She is having mild UC's every 5 mins. She receives [facility] with [facility]. Her spouse is present and contributing to the history taking.     OB History     Gravida  1   Para  0   Term  0   Preterm  0   AB  0   Living  0      SAB  0   IAB  0   Ectopic  0   Multiple  0   Live Births  0           Past Medical History:  Diagnosis Date   Anxiety    Breast cyst, right    Depression    Panic attacks     Past Surgical History:  Procedure Laterality Date   WISDOM TOOTH EXTRACTION      Family History  Problem Relation Age of Onset   Healthy Mother    Diabetes Father    Healthy Father     Social History   Tobacco Use   Smoking status: Never   Smokeless tobacco: Never  Vaping Use   Vaping status: Former   Substances: Nicotine  Substance Use Topics   Alcohol use: Yes   Drug use: Never    Allergies:  Allergies  Allergen Reactions   Penicillins Rash   Amoxicillin    Zoloft [Sertraline] Other (See Comments)    Flat affect on low dose    Medications Prior to Admission  Medication Sig Dispense Refill Last Dose   fexofenadine (ALLEGRA) 180 MG tablet Take 180 mg by mouth daily.   04/10/2023   FLUoxetine (PROZAC) 20 MG capsule TAKE (3) CAPSULES BY MOUTH ONCE DAILY. (Patient taking differently: Take 40 mg by mouth 2 (two) times daily. 40 mg in am, 40 mg in bedtime) 90 capsule 1 04/10/2023   Prenatal Vit-Fe Fumarate-FA  (PRENATAL MULTIVITAMIN) TABS tablet Take 1 tablet by mouth daily at 12 noon.   04/09/2023   clonazePAM (KLONOPIN) 0.5 MG tablet TAKE 1/2 TO 1 TABLET TWICE DAILY AS NEEDED FOR AGITATION. (Patient not taking: Reported on 12/27/2022) 20 tablet 1    fluticasone (FLONASE) 50 MCG/ACT nasal spray Place 1 spray into both nostrils daily for 14 days. 16 g 0    naproxen (NAPROSYN) 500 MG tablet Take 1 tablet (500 mg total) by mouth 2 (two) times daily with a meal. (Patient not taking: Reported on 12/27/2022) 30 tablet 0    norethindrone-ethinyl estradiol 1/35 (ALAYCEN 1/35) tablet Take 1 tablet by mouth daily. (Patient not taking: Reported on 12/27/2022) 30 tablet 11    tiZANidine (ZANAFLEX) 4 MG tablet Take 1 tablet (4 mg total) by mouth at bedtime. (Patient not taking: Reported on 12/27/2022) 30 tablet 0     Review of Systems  Constitutional: Negative.   HENT: Negative.    Eyes: Negative.  Respiratory: Negative.    Cardiovascular: Negative.   Gastrointestinal: Negative.   Endocrine: Negative.   Genitourinary: Negative.   Musculoskeletal: Negative.   Skin: Negative.   Allergic/Immunologic: Negative.   Neurological: Negative.   Hematological: Negative.   Psychiatric/Behavioral: Negative.     Physical Exam  Patient Vitals for the past 24 hrs:  BP Temp Pulse Resp SpO2 Height Weight  04/10/23 1915 131/87 -- (!) 122 -- 97 % -- --  04/10/23 1900 137/85 -- (!) 114 -- 99 % -- --  04/10/23 1845 137/83 -- (!) 117 -- 99 % -- --  04/10/23 1831 (!) 139/91 -- (!) 119 -- 99 % -- --  04/10/23 1820 (!) 141/89 -- (!) 117 20 98 % -- --  04/10/23 1815 -- -- -- -- 98 % -- --  04/10/23 1757 (!) 143/89 98 F (36.7 C) (!) 128 18 -- 5\' 4"  (1.626 m) 96.6 kg     Physical Exam Vitals and nursing note reviewed.  Constitutional:      Appearance: Normal appearance. She is obese.  Cardiovascular:     Rate and Rhythm: Regular rhythm. Tachycardia present.     Pulses: Normal pulses.     Heart sounds: Normal heart sounds.   Pulmonary:     Effort: Pulmonary effort is normal.     Breath sounds: Normal breath sounds.  Abdominal:     General: Bowel sounds are normal.     Palpations: Abdomen is soft.  Genitourinary:    Comments: Deferred -- cervical exam in the office was 3 cm per pt Musculoskeletal:        General: Normal range of motion.  Skin:    General: Skin is warm and dry.  Neurological:     Mental Status: She is alert and oriented to person, place, and time.  Psychiatric:        Mood and Affect: Mood normal.        Behavior: Behavior normal.        Thought Content: Thought content normal.        Judgment: Judgment normal.    REACTIVE NST - FHR: 135 bpm / moderate variability / accels present / decels absent / TOCO: regular every 2-5 mins  MAU Course  Procedures  MDM CCUA CBC CMP P/C Ratio Serial BP's   Results for orders placed or performed during the hospital encounter of 04/10/23 (from the past 24 hour(s))  CBC     Status: Abnormal   Collection Time: 04/10/23  5:52 PM  Result Value Ref Range   WBC 8.9 4.0 - 10.5 K/uL   RBC 3.81 (L) 3.87 - 5.11 MIL/uL   Hemoglobin 12.4 12.0 - 15.0 g/dL   HCT 69.6 (L) 29.5 - 28.4 %   MCV 94.2 80.0 - 100.0 fL   MCH 32.5 26.0 - 34.0 pg   MCHC 34.5 30.0 - 36.0 g/dL   RDW 13.2 44.0 - 10.2 %   Platelets 249 150 - 400 K/uL   nRBC 0.0 0.0 - 0.2 %  Comprehensive metabolic panel     Status: Abnormal   Collection Time: 04/10/23  5:52 PM  Result Value Ref Range   Sodium 132 (L) 135 - 145 mmol/L   Potassium 3.6 3.5 - 5.1 mmol/L   Chloride 102 98 - 111 mmol/L   CO2 20 (L) 22 - 32 mmol/L   Glucose, Bld 99 70 - 99 mg/dL   BUN 8 6 - 20 mg/dL   Creatinine, Ser 7.25 0.44 - 1.00 mg/dL  Calcium 9.0 8.9 - 10.3 mg/dL   Total Protein 6.1 (L) 6.5 - 8.1 g/dL   Albumin 3.0 (L) 3.5 - 5.0 g/dL   AST 37 15 - 41 U/L   ALT 27 0 - 44 U/L   Alkaline Phosphatase 203 (H) 38 - 126 U/L   Total Bilirubin 0.7 0.3 - 1.2 mg/dL   GFR, Estimated >32 >95 mL/min   Anion gap 10  5 - 15     *TC to  Dr. Lorane Gell @ 2003 - notified of patient's complaints, assessments, serial BPs, lab, & NST results, tx plan admit for IOL for gHTN - agrees with plan, will come speak to patient and spouse about plan of care  Assessment and Plan  1. Gestational hypertension, third trimester  2. [redacted] weeks gestation of pregnancy - Admit to L&D for IOL - Routine L&D admission orders - Dr. Lorane Gell to assume care of patient upon admission  Raelyn Mora, CNM 04/10/2023, 6:59 PM

## 2023-04-10 NOTE — Progress Notes (Signed)
Pt stated, "I have felt the baby move more than 10 times since I've been here."

## 2023-04-10 NOTE — Progress Notes (Signed)
Patient doing well.  Cervix with interval change since MAU to 4.5 cm without augmentation.  90% effaced, 0 station.  Head well applied and AROM performed in standard fashion with return of clear fluid.  BP (!) 136/96   Pulse (!) 113   Temp 97.6 F (36.4 C)   Resp 20   Ht 5\' 4"  (1.626 m)   Wt 96.6 kg   LMP 07/16/2022   SpO2 98%   BMI 36.56 kg/m   She remains comfortable but may consider epidural soon.   FHT cat 1 with accels.  Nilda Simmer MD

## 2023-04-10 NOTE — H&P (Signed)
OB History and Physical   Stacey Prince is a 27 y.o. female G1P0 presenting for elevated blood pressures at [redacted]w[redacted]d.  She was noted to have elevated BP in office this afternoon and sent to MAU for serial BP monitoring, labs.  She denies headache, vision changes, RUQ or epigastric pain.   Pregnancy has been uncomplicated.  Rh negative (s/p Rhogam 01/30/2023), GBS negative, panorama low risk.   OB History     Gravida  1   Para  0   Term  0   Preterm  0   AB  0   Living  0      SAB  0   IAB  0   Ectopic  0   Multiple  0   Live Births  0          Past Medical History:  Diagnosis Date   Anxiety    Breast cyst, right    Depression    Panic attacks    Past Surgical History:  Procedure Laterality Date   WISDOM TOOTH EXTRACTION     Family History: family history includes Diabetes in her father; Healthy in her father and mother. Social History:  reports that she has never smoked. She has never used smokeless tobacco. She reports current alcohol use. She reports that she does not use drugs.     Maternal Diabetes: No Genetic Screening: Normal Maternal Ultrasounds/Referrals: Normal Fetal Ultrasounds or other Referrals:  None Maternal Substance Abuse:  No Significant Maternal Medications:  None Significant Maternal Lab Results:  Group B Strep negative and Rh negative Other Comments:  None  Review of Systems - Patient denies fever, chills, SOB, CP, N/V/D.  History   Blood pressure (!) 134/94, pulse (!) 119, temperature 98 F (36.7 C), resp. rate 20, height 5\' 4"  (1.626 m), weight 96.6 kg, last menstrual period 07/16/2022, SpO2 98%. Exam Physical Exam   Gen: alert, well appearing, no distress Chest: nonlabored breathing CV: no peripheral edema Abdomen: soft, gravid  Ext: no evidence of DVT  Prenatal labs: ABO, Rh:   Antibody:   Rubella:   RPR:    HBsAg:    HIV:    GBS:     Assessment/Plan: Admit to Labor and Delivery gHTN - criteria met.   Recommend induction for GA > 37 weeks.  AROM, pit for induction Epidural when desired PIH labs WNL.  P/C ratio   Lyn Henri 04/10/2023, 8:36 PM

## 2023-04-10 NOTE — MAU Note (Signed)

## 2023-04-11 ENCOUNTER — Inpatient Hospital Stay (HOSPITAL_COMMUNITY): Payer: BC Managed Care – PPO | Admitting: Anesthesiology

## 2023-04-11 LAB — CBC
HCT: 33 % — ABNORMAL LOW (ref 36.0–46.0)
Hemoglobin: 11.5 g/dL — ABNORMAL LOW (ref 12.0–15.0)
MCH: 33.3 pg (ref 26.0–34.0)
MCHC: 34.8 g/dL (ref 30.0–36.0)
MCV: 95.7 fL (ref 80.0–100.0)
Platelets: 202 10*3/uL (ref 150–400)
RBC: 3.45 MIL/uL — ABNORMAL LOW (ref 3.87–5.11)
RDW: 13 % (ref 11.5–15.5)
WBC: 26.2 10*3/uL — ABNORMAL HIGH (ref 4.0–10.5)
nRBC: 0 % (ref 0.0–0.2)

## 2023-04-11 MED ORDER — PHENYLEPHRINE 80 MCG/ML (10ML) SYRINGE FOR IV PUSH (FOR BLOOD PRESSURE SUPPORT): 80.0000 ug | PREFILLED_SYRINGE | INTRAVENOUS | Status: DC | PRN

## 2023-04-11 MED ORDER — PRENATAL MULTIVITAMIN CH
1.0000 | ORAL_TABLET | Freq: Every day | ORAL | Status: DC
  Administered 2023-04-12 – 2023-04-13 (×2): 1 via ORAL
  Filled 2023-04-11 (×2): qty 1

## 2023-04-11 MED ORDER — TETANUS-DIPHTH-ACELL PERTUSSIS 5-2.5-18.5 LF-MCG/0.5 IM SUSY: 0.5000 mL | PREFILLED_SYRINGE | Freq: Once | INTRAMUSCULAR | Status: DC

## 2023-04-11 MED ORDER — WITCH HAZEL-GLYCERIN EX PADS: 1.0000 | MEDICATED_PAD | CUTANEOUS | Status: DC | PRN

## 2023-04-11 MED ORDER — EPHEDRINE 5 MG/ML INJ: 10.0000 mg | INTRAVENOUS | Status: DC | PRN

## 2023-04-11 MED ORDER — IBUPROFEN 600 MG PO TABS
600.0000 mg | ORAL_TABLET | Freq: Four times a day (QID) | ORAL | Status: DC
  Administered 2023-04-11 – 2023-04-13 (×7): 600 mg via ORAL
  Filled 2023-04-11 (×7): qty 1

## 2023-04-11 MED ORDER — FERROUS SULFATE 325 (65 FE) MG PO TABS
325.0000 mg | ORAL_TABLET | Freq: Every day | ORAL | Status: DC
  Administered 2023-04-12 – 2023-04-13 (×2): 325 mg via ORAL
  Filled 2023-04-11 (×2): qty 1

## 2023-04-11 MED ORDER — OXYCODONE HCL 5 MG PO TABS: 10.0000 mg | ORAL_TABLET | ORAL | Status: DC | PRN

## 2023-04-11 MED ORDER — FENTANYL-BUPIVACAINE-NACL 0.5-0.125-0.9 MG/250ML-% EP SOLN
EPIDURAL | Status: AC
  Filled 2023-04-11: qty 250

## 2023-04-11 MED ORDER — PHENYLEPHRINE 80 MCG/ML (10ML) SYRINGE FOR IV PUSH (FOR BLOOD PRESSURE SUPPORT)
PREFILLED_SYRINGE | INTRAVENOUS | Status: AC
  Filled 2023-04-11: qty 10

## 2023-04-11 MED ORDER — ACETAMINOPHEN 325 MG PO TABS: 650.0000 mg | ORAL_TABLET | ORAL | Status: DC | PRN

## 2023-04-11 MED ORDER — FENTANYL-BUPIVACAINE-NACL 0.5-0.125-0.9 MG/250ML-% EP SOLN
12.0000 mL/h | EPIDURAL | Status: DC | PRN
  Administered 2023-04-11: 12 mL/h via EPIDURAL

## 2023-04-11 MED ORDER — SODIUM BICARBONATE 8.4 % IV SOLN
INTRAVENOUS | Status: DC | PRN
  Administered 2023-04-11: 5 mL via EPIDURAL

## 2023-04-11 MED ORDER — LACTATED RINGERS IV SOLN
500.0000 mL | Freq: Once | INTRAVENOUS | Status: AC
  Administered 2023-04-11: 500 mL via INTRAVENOUS

## 2023-04-11 MED ORDER — SENNOSIDES-DOCUSATE SODIUM 8.6-50 MG PO TABS
2.0000 | ORAL_TABLET | Freq: Every day | ORAL | Status: DC
  Administered 2023-04-12 – 2023-04-13 (×2): 2 via ORAL
  Filled 2023-04-11 (×2): qty 2

## 2023-04-11 MED ORDER — OXYCODONE HCL 5 MG PO TABS: 5.0000 mg | ORAL_TABLET | ORAL | Status: DC | PRN

## 2023-04-11 MED ORDER — DIPHENHYDRAMINE HCL 25 MG PO CAPS: 25.0000 mg | ORAL_CAPSULE | Freq: Four times a day (QID) | ORAL | Status: DC | PRN

## 2023-04-11 MED ORDER — ONDANSETRON HCL 4 MG PO TABS: 4.0000 mg | ORAL_TABLET | ORAL | Status: DC | PRN

## 2023-04-11 MED ORDER — BENZOCAINE-MENTHOL 20-0.5 % EX AERO
1.0000 | INHALATION_SPRAY | CUTANEOUS | Status: DC | PRN
  Administered 2023-04-11 – 2023-04-13 (×2): 1 via TOPICAL
  Filled 2023-04-11 (×2): qty 56

## 2023-04-11 MED ORDER — DIBUCAINE (PERIANAL) 1 % EX OINT: 1.0000 | TOPICAL_OINTMENT | CUTANEOUS | Status: DC | PRN

## 2023-04-11 MED ORDER — SIMETHICONE 80 MG PO CHEW: 80.0000 mg | CHEWABLE_TABLET | ORAL | Status: DC | PRN

## 2023-04-11 MED ORDER — DIPHENHYDRAMINE HCL 50 MG/ML IJ SOLN: 12.5000 mg | INTRAMUSCULAR | Status: DC | PRN

## 2023-04-11 MED ORDER — COCONUT OIL OIL: 1.0000 | TOPICAL_OIL | Status: DC | PRN

## 2023-04-11 MED ORDER — ONDANSETRON HCL 4 MG/2ML IJ SOLN: 4.0000 mg | INTRAMUSCULAR | Status: DC | PRN

## 2023-04-11 MED ORDER — ZOLPIDEM TARTRATE 5 MG PO TABS: 5.0000 mg | ORAL_TABLET | Freq: Every evening | ORAL | Status: DC | PRN

## 2023-04-11 MED ORDER — LACTATED RINGERS IV SOLN: 500.0000 mL | Freq: Once | INTRAVENOUS | Status: DC

## 2023-04-11 NOTE — Anesthesia Preprocedure Evaluation (Signed)
Anesthesia Evaluation  Patient identified by MRN, date of birth, ID band Patient awake    Reviewed: Allergy & Precautions, Patient's Chart, lab work & pertinent test results  Airway Mallampati: II       Dental no notable dental hx.    Pulmonary    Pulmonary exam normal        Cardiovascular hypertension, Normal cardiovascular exam     Neuro/Psych  PSYCHIATRIC DISORDERS Anxiety Depression       GI/Hepatic   Endo/Other    Renal/GU      Musculoskeletal   Abdominal   Peds  Hematology   Anesthesia Other Findings   Reproductive/Obstetrics (+) Pregnancy                             Anesthesia Physical Anesthesia Plan  ASA: 2  Anesthesia Plan: Epidural   Post-op Pain Management:    Induction:   PONV Risk Score and Plan: 0  Airway Management Planned: Natural Airway  Additional Equipment:   Intra-op Plan:   Post-operative Plan:   Informed Consent: I have reviewed the patients History and Physical, chart, labs and discussed the procedure including the risks, benefits and alternatives for the proposed anesthesia with the patient or authorized representative who has indicated his/her understanding and acceptance.       Plan Discussed with:   Anesthesia Plan Comments: (Lab Results      Component                Value               Date                      WBC                      8.9                 04/10/2023                HGB                      12.4                04/10/2023                HCT                      35.9 (L)            04/10/2023                MCV                      94.2                04/10/2023                PLT                      249                 04/10/2023           )       Anesthesia Quick Evaluation

## 2023-04-11 NOTE — Lactation Note (Signed)
This note was copied from a baby's chart. Lactation Consultation Note  Patient Name: Stacey Prince [id number] Date: 04/11/2023 Age:26 hours  Infant BF at 2130 pm for 20 minutes. Birth Parent is resting and would like to be seen by [facility] services in the morning.    Maternal Data    Feeding    LATCH Score                    Lactation Tools Discussed/Used    Interventions    Discharge    Consult Status      Frederico Hamman 04/11/2023, 10:35 PM

## 2023-04-11 NOTE — Lactation Note (Signed)
This note was copied from a baby's chart. Lactation Consultation Note  Patient Name: Stacey Prince [id number] Date: 04/11/2023 Age:27 hours Reason for consult: Initial assessment;1st time breastfeeding;Early term 37-38.6wks;Mother's request.  When LC returned to room, infant had finished BF for 10 minutes at 2300 pm, Per Birth Parent, RN assisted with latching infant at the breast. Infant currently asleep in basinet. LC discussed if infant is still cuing after BF on 1st breast to offer the 2nd breast during the same feeding. Birth Parent will continue to BF infant by cues, on demand, 8 to 12+ times within 24 hours, skin to skin. Birth Parent knows to call RN/LC for further latch assistance if needed. LC discussed infant's input and output and infant has one void diaper since birth. LC discussed the importance of maternal rest, diet and hydration. Birth Parent was made aware of O/P services, breastfeeding support groups, community resources, and our phone # for post-discharge questions.    Maternal Data Has patient been taught Hand Expression?: Yes Does the patient have breastfeeding experience prior to this delivery?: No  Feeding Mother's Current Feeding Choice: Breast Milk  LATCH Score  LC did not observe latch, see flow sheet, RN assisted with recent feeding.                  Lactation Tools Discussed/Used    Interventions Interventions: Breast feeding basics reviewed;Position options;Skin to skin;Hand express;Breast massage;Breast compression;LC Services brochure  Discharge Pump: DEBP;Personal  Consult Status Consult Status: Follow-up Date: 04/12/23 Follow-up type: In-patient    Stacey Prince 04/11/2023, 11:32 PM

## 2023-04-11 NOTE — Progress Notes (Signed)
Lamonda Noxon is a 27 y.o. G1P0000 at [redacted]w[redacted]d by ultrasound admitted for induction of labor due to [facility].  Subjective: Comfortable with CLEA.  Objective: BP 124/78   Pulse (!) 101   Temp 98.9 F (37.2 C) (Axillary)   Resp 20   Ht 5\' 4"  (1.626 m)   Wt 96.6 kg   LMP 07/16/2022   SpO2 98%   BMI 36.56 kg/m  No intake/output data recorded. No intake/output data recorded.  FHT:  FHR: 150 bpm, variability: minimal ,  accelerations:  Present,  decelerations:  Absent UC:   regular, every 2 minutes SVE:   Dilation: 9 Effacement (%): 90 Station: 0 Exam by:: Kittie Plater, RNC  Labs: Lab Results  Component Value Date   WBC 8.9 04/10/2023   HGB 12.4 04/10/2023   HCT 35.9 (L) 04/10/2023   MCV 94.2 04/10/2023   PLT 249 04/10/2023    Assessment / Plan: Induction of labor due to gestational hypertension,  progressing well on pitocin  Labor: Progressing normally Preeclampsia:   n/a Fetal Wellbeing:  Category I and Category II Pain Control:  Epidural I/D:  n/a Anticipated MOD:  NSVD  Mitchel Honour, DO 04/11/2023, 1:31 PM

## 2023-04-11 NOTE — Anesthesia Procedure Notes (Signed)
Epidural Patient location during procedure: OB Start time: 04/11/2023 4:04 AM End time: 04/11/2023 4:10 AM  Staffing Anesthesiologist: Shelton Silvas, MD Performed: anesthesiologist   Preanesthetic Checklist Completed: patient identified, IV checked, site marked, risks and benefits discussed, surgical consent, monitors and equipment checked, pre-op evaluation and timeout performed  Epidural Patient position: sitting Prep: DuraPrep Patient monitoring: heart rate, continuous pulse ox and blood pressure Approach: midline Location: L3-L4 Injection technique: LOR saline  Needle:  Needle type: Tuohy  Needle gauge: 17 G Needle length: 9 cm Catheter type: closed end flexible Catheter size: 20 Guage Test dose: negative and 1.5% lidocaine  Assessment Events: blood not aspirated, no cerebrospinal fluid, injection not painful, no injection resistance and no paresthesia  Additional Notes LOR @ 4  Patient identified. Risks/Benefits/Options discussed with patient including but not limited to bleeding, infection, nerve damage, paralysis, failed block, incomplete pain control, headache, blood pressure changes, nausea, vomiting, reactions to medications, itching and postpartum back pain. Confirmed with bedside nurse the patient's most recent platelet count. Confirmed with patient that they are not currently taking any anticoagulation, have any bleeding history or any family history of bleeding disorders. Patient expressed understanding and wished to proceed. All questions were answered. Sterile technique was used throughout the entire procedure. Please see nursing notes for vital signs. Test dose was given through epidural catheter and negative prior to continuing to dose epidural or start infusion. Warning signs of high block given to the patient including shortness of breath, tingling/numbness in hands, complete motor block, or any concerning symptoms with instructions to call for help. Patient was  given instructions on fall risk and not to get out of bed. All questions and concerns addressed with instructions to call with any issues or inadequate analgesia.    Reason for block:procedure for pain

## 2023-04-11 NOTE — Progress Notes (Signed)
Stacey Prince is a 27 y.o. G1P0000 at [redacted]w[redacted]d by ultrasound admitted for induction of labor due to [facility].  Subjective: No HA, vision change, RUQ pain, CP/SOB.  Comfortable with CLEA  Objective: BP 127/81   Pulse (!) 101   Temp 97.7 F (36.5 C) (Oral)   Resp 20   Ht 5\' 4"  (1.626 m)   Wt 96.6 kg   LMP 07/16/2022   SpO2 98%   BMI 36.56 kg/m  No intake/output data recorded. No intake/output data recorded.  FHT:  FHR: 130 bpm, variability: moderate,  accelerations:  Present,  decelerations:  Absent UC:   irregular, every 2-4 minutes SVE:   5/c/0, IUPC placed  Labs: Lab Results  Component Value Date   WBC 8.9 04/10/2023   HGB 12.4 04/10/2023   HCT 35.9 (L) 04/10/2023   MCV 94.2 04/10/2023   PLT 249 04/10/2023    Assessment / Plan: Protracted active phase  Labor:  Pitocin 10 mU.  IUPC now inserted.  Will monitor MVUs to adequacy; follow labor curve Preeclampsia:   n/a.  GHTN with normal to mild range BPs.  Asymptomatic Fetal Wellbeing:  Category I Pain Control:  Epidural I/D:  n/a Anticipated MOD:  NSVD  Babetta Paterson, DO 04/11/2023, 8:12 AM

## 2023-04-12 LAB — CBC
HCT: 31.3 % — ABNORMAL LOW (ref 36.0–46.0)
Hemoglobin: 10.8 g/dL — ABNORMAL LOW (ref 12.0–15.0)
MCH: 33.1 pg (ref 26.0–34.0)
MCHC: 34.5 g/dL (ref 30.0–36.0)
MCV: 96 fL (ref 80.0–100.0)
Platelets: 185 10*3/uL (ref 150–400)
RBC: 3.26 MIL/uL — ABNORMAL LOW (ref 3.87–5.11)
RDW: 13.1 % (ref 11.5–15.5)
WBC: 25.4 10*3/uL — ABNORMAL HIGH (ref 4.0–10.5)
nRBC: 0 % (ref 0.0–0.2)

## 2023-04-12 LAB — RPR: RPR Ser Ql: NONREACTIVE — AB

## 2023-04-12 NOTE — Progress Notes (Signed)
Post Partum Day 1 Subjective: no complaints, up ad lib, voiding, and tolerating PO.  No HA, vision change, RUQ pain, CP/SOB.  Objective: Blood pressure 114/89, pulse 94, temperature 98.1 F (36.7 C), temperature source Oral, resp. rate 18, height 5\' 4"  (1.626 m), weight 96.6 kg, last menstrual period 07/16/2022, SpO2 98%.  Physical Exam:  General: alert, cooperative, and appears stated age 27: appropriate Uterine Fundus: firm Incision: healing well, no significant drainage, no dehiscence DVT Evaluation: No evidence of DVT seen on physical exam. Negative Homan's sign. No cords or calf tenderness.  Recent Labs    04/11/23 1946 04/12/23 0430  HGB 11.5* 10.8*  HCT 33.0* 31.3*    Assessment/Plan: Plan for discharge tomorrow GHTN-normal to mild range BPs and asymptomatic.  No medication at this time but will continue to monitor. Elevated WBC of 25.4.  Suspect early chorio prior to delivery given prolonged ROM.  No fever.  Will repeat CBC tomorrow am   LOS: 2 days   Mitchel Honour, DO 04/12/2023, 9:04 AM

## 2023-04-12 NOTE — Progress Notes (Signed)
Stacey Prince was referred for history of depression/anxiety. * Referral screened out by Clinical Social Worker because none of the following criteria appear to apply: ~ History of anxiety/depression during this pregnancy, or of post-partum depression following prior delivery. ~ Diagnosis of anxiety and/or depression within last 3 years OR * Stacey Prince's symptoms currently being treated with medication and/or therapy. No concerns noted in OB records.  Please contact the Clinical Social Worker if needs arise, by [facility] request, or if Stacey Prince scores greater than 9/yes to question 10 on [place] Postpartum Depression Screen.

## 2023-04-12 NOTE — Anesthesia Postprocedure Evaluation (Signed)
Anesthesia Post Note  Patient: Stacey Prince  Procedure(s) Performed: AN AD HOC LABOR EPIDURAL     Patient location during evaluation: Mother Baby Anesthesia Type: Epidural Level of consciousness: awake and alert Pain management: pain level controlled Vital Signs Assessment: post-procedure vital signs reviewed and stable Respiratory status: spontaneous breathing, nonlabored ventilation and respiratory function stable Cardiovascular status: stable Postop Assessment: no headache, no backache and epidural receding Anesthetic complications: no   No notable events documented.  Last Vitals:  Vitals:   04/12/23 0232 04/12/23 0633  BP: 117/75 114/89  Pulse: 80 94  Resp: 16 18  Temp: 36.6 C 36.7 C  SpO2: 97% 98%    Last Pain:  Vitals:   04/12/23 0633  TempSrc: Oral  PainSc:    Pain Goal:                   Salome Arnt

## 2023-04-13 LAB — CBC
HCT: 30.8 % — ABNORMAL LOW (ref 36.0–46.0)
Hemoglobin: 10.4 g/dL — ABNORMAL LOW (ref 12.0–15.0)
MCH: 32.9 pg (ref 26.0–34.0)
MCHC: 33.8 g/dL (ref 30.0–36.0)
MCV: 97.5 fL (ref 80.0–100.0)
Platelets: 215 10*3/uL (ref 150–400)
RBC: 3.16 MIL/uL — ABNORMAL LOW (ref 3.87–5.11)
RDW: 13 % (ref 11.5–15.5)
WBC: 16.1 10*3/uL — ABNORMAL HIGH (ref 4.0–10.5)
nRBC: 0 % (ref 0.0–0.2)

## 2023-04-13 MED ORDER — IBUPROFEN 600 MG PO TABS: 600.0000 mg | ORAL_TABLET | Freq: Four times a day (QID) | ORAL | 0 refills | Status: AC

## 2023-04-13 NOTE — Discharge Instructions (Signed)
Call MD for T>100.4, heavy vaginal bleeding, severe abdominal pain, or respiratory distress.  Call office to schedule postpartum visit in 6 weeks.  Pelvic rest x 6 weeks.   

## 2023-04-13 NOTE — Discharge Summary (Signed)
Postpartum Discharge Summary  Patient Name: Stacey Prince DOB: [birth year] MRN: [id number]  Date of admission: 04/10/2023 Delivery date:04/11/2023 Delivering provider: Mitchel Honour Date of discharge: 04/13/2023  Admitting diagnosis: Gestational hypertension [O13.9] Intrauterine pregnancy: [redacted]w[redacted]d     Secondary diagnosis:  Principal Problem:   Gestational hypertension  Additional problems: none    Discharge diagnosis: Term Pregnancy Delivered                                              Post partum procedures: none Augmentation: AROM and Pitocin Complications: None  Hospital course: Induction of Labor With Vaginal Delivery   27 y.o. yo G1P0000 at [redacted]w[redacted]d was admitted to the hospital 04/10/2023 for induction of labor.  Indication for induction: Gestational hypertension.  Patient had an labor course complicated by prolonged 2nd stage Membrane Rupture Time/Date: 10:15 PM,04/10/2023  Delivery Method:Vaginal, Spontaneous Episiotomy: None Lacerations:  2nd degree Details of delivery can be found in separate delivery note.  Patient had a postpartum course complicated by none. Patient is discharged home 04/13/23.  Newborn Data: Birth date:04/11/2023 Birth time:6:26 PM Gender:Female Living status:Living Apgars:5 ,8  Weight:3670 g  Magnesium Sulfate received: No BMZ received: No Rhophylac:No MMR:No T-DaP:Given prenatally Flu: No Transfusion:No  Physical exam  Vitals:   04/12/23 1132 04/12/23 1538 04/12/23 2012 04/13/23 0518  BP: 128/88 124/84 117/81 115/75  Pulse: 96 89 92 81  Resp:  20 18 16   Temp:  (!) 97.5 F (36.4 C) 97.7 F (36.5 C) (!) 97.5 F (36.4 C)  TempSrc:  Oral Oral   SpO2:  97% 97% 100%  Weight:      Height:       General: alert, cooperative, and no distress Lochia: appropriate Uterine Fundus: firm Incision: Healing well with no significant drainage, No significant erythema DVT Evaluation: No evidence of DVT seen on physical exam. Negative  Homan's sign. No cords or calf tenderness. Labs: Lab Results  Component Value Date   WBC 16.1 (H) 04/13/2023   HGB 10.4 (L) 04/13/2023   HCT 30.8 (L) 04/13/2023   MCV 97.5 04/13/2023   PLT 215 04/13/2023      Latest Ref Rng & Units 04/10/2023    5:52 PM  CMP  Glucose 70 - 99 mg/dL 99   BUN 6 - 20 mg/dL 8   Creatinine 8.46 - 9.62 mg/dL 9.52   Sodium 841 - 324 mmol/L 132   Potassium 3.5 - 5.1 mmol/L 3.6   Chloride 98 - 111 mmol/L 102   CO2 22 - 32 mmol/L 20   Calcium 8.9 - 10.3 mg/dL 9.0   Total Protein 6.5 - 8.1 g/dL 6.1   Total Bilirubin 0.3 - 1.2 mg/dL 0.7   Alkaline Phos 38 - 126 U/L 203   AST 15 - 41 U/L 37   ALT 0 - 44 U/L 27    Edinburgh Score:    04/12/2023    5:24 PM  Edinburgh Postnatal Depression Scale Screening Tool  I have been able to laugh and see the funny side of things. 0  I have looked forward with enjoyment to things. 0  I have blamed myself unnecessarily when things went wrong. 2  I have been anxious or worried for no good reason. 2  I have felt scared or panicky for no good reason. 1  Things have been getting on top of me. 0  I have been so unhappy that I have had difficulty sleeping. 0  I have felt sad or miserable. 0  I have been so unhappy that I have been crying. 0  The thought of harming myself has occurred to me. 0  Edinburgh Postnatal Depression Scale Total 5     After visit meds:  Allergies as of 04/13/2023       Reactions   Penicillins Rash   Amoxil [amoxicillin] Rash   Zoloft [sertraline] Other (See Comments)   Flat affect on low dose        Medication List     TAKE these medications    fexofenadine 180 MG tablet Commonly known as: ALLEGRA Take 180 mg by mouth daily.   FLUoxetine 40 MG capsule Commonly known as: PROZAC Take 40 mg by mouth 2 (two) times daily.   ibuprofen 600 MG tablet Commonly known as: ADVIL Take 1 tablet (600 mg total) by mouth every 6 (six) hours.   prenatal multivitamin Tabs tablet Take 1  tablet by mouth daily.         Discharge home in stable condition Infant Feeding: Breast Infant Disposition:home with mother Discharge instruction: per After Visit Summary and Postpartum booklet. Activity: Advance as tolerated. Pelvic rest for 6 weeks.  Diet: routine diet Future Appointments:No future appointments. Follow up Visit: 6 weeks PPV  04/13/2023 Mitchel Honour, DO

## 2023-04-13 NOTE — Lactation Note (Signed)
This note was copied from a baby's chart. Lactation Consultation Note  Patient Name: Stacey Prince [id number] Date: 04/13/2023 Age:26 hours Reason for consult: Breastfeeding assistance;Early term 37-38.6wks;1st time breastfeeding (weight loss -6.69%) Breastfeeding is going well, infant is latching on both breast during most feedings, Birth Parent does not have any questions for LC at this time, Birth Parent latched infant on her right breast using the cross cradle hold, LC did not assist with latch and infant was still breastfeeding after 10 minutes when LC left the room. LC discussed infant's input and out put and what to expect the 1st week of life, infant had 3 stools and 5 voids. LC reinforced maternal rest, diet and hydration.   LC reviewed discharge education: LC discussed engorgement prevention and treatment, warning signs of dehydration in infant and how to know breastfeeding is going well. Birth Parent was given community resources for ongoing help and  breastfeeding support- LC hotline, LC Postpartum BF support group and LC Outpatient.  Maternal Data    Feeding Mother's Current Feeding Choice: Breast Milk  LATCH Score Latch: Grasps breast easily, tongue down, lips flanged, rhythmical sucking.  Audible Swallowing: A few with stimulation  Type of Nipple: Everted at rest and after stimulation  Comfort (Breast/Nipple): Soft / non-tender  Hold (Positioning): No assistance needed to correctly position infant at breast.  LATCH Score: 9   Lactation Tools Discussed/Used    Interventions Interventions: Position options;Skin to skin;Breast compression;Education  Discharge Discharge Education: Engorgement and breast care;Warning signs for feeding baby;Outpatient recommendation Pump: DEBP;Personal  Consult Status Consult Status: Complete Date: 04/13/23 Follow-up type: Physician    Frederico Hamman 04/13/2023, 12:45 PM

## 2023-04-13 NOTE — Progress Notes (Signed)
Post Partum Day 2 Subjective: no complaints, up ad lib, voiding, and tolerating PO.  No HA, vision change, RUQ pain, CP/SOB  Objective: Blood pressure 115/75, pulse 81, temperature (!) 97.5 F (36.4 C), resp. rate 16, height 5\' 4"  (1.626 m), weight 96.6 kg, last menstrual period 07/16/2022, SpO2 100%.  Physical Exam:  General: alert, cooperative, and appears stated age Lochia: appropriate Uterine Fundus: firm Incision: healing well, no significant drainage DVT Evaluation: No evidence of DVT seen on physical exam. Negative Homan's sign. No cords or calf tenderness.  Recent Labs    04/12/23 0430 04/13/23 0403  HGB 10.8* 10.4*  HCT 31.3* 30.8*    Assessment/Plan: Discharge home and Breastfeeding GHTN-no medication.  No symptoms of Pre-E. Leukocytosis-WBC 25.4 yesterday am to 16.1 this am.  Afebrile and uterus non-tender.   LOS: 3 days   Mitchel Honour, DO 04/13/2023, 8:08 AM

## 2023-04-16 LAB — SURGICAL PATHOLOGY

## 2023-04-17 ENCOUNTER — Encounter (HOSPITAL_COMMUNITY): Payer: Self-pay

## 2023-05-08 ENCOUNTER — Telehealth (HOSPITAL_COMMUNITY): Payer: Self-pay | Admitting: *Deleted

## 2023-05-08 NOTE — Telephone Encounter (Signed)
05/08/2023  Name: Stacey Prince MRN: [id number] DOB: 1995/10/09  Reason for Call:  Transition of Care Hospital Discharge Call  Contact Status: Patient Contact Status: Complete  Language assistant needed: Interpreter Mode: Interpreter Not Needed        Follow-Up Questions: Do You Have Any Concerns About Your Health As You Heal From Delivery?: No Do You Have Any Concerns About Your Infants Health?: No  Edinburgh Postnatal Depression Scale:  In the Past 7 Days: I have been able to laugh and see the funny side of things.: As much as I always could I have looked forward with enjoyment to things.: As much as I ever did I have blamed myself unnecessarily when things went wrong.: Yes, some of the time I have been anxious or worried for no good reason.: Yes, sometimes I have felt scared or panicky for no good reason.: No, not much Things have been getting on top of me.: No, most of the time I have coped quite well I have been so unhappy that I have had difficulty sleeping.: Not at all I have felt sad or miserable.: No, not at all I have been so unhappy that I have been crying.: No, never The thought of harming myself has occurred to me.: Never Inocente Salles Postnatal Depression Scale Total: 6  PHQ2-9 Depression Scale:     Discharge Follow-up: Edinburgh score requires follow up?: No Patient was advised of the following resources:: Breastfeeding Support Group, Support Group (declines postpartum group information via email)  Post-discharge interventions: Reviewed Safe Sleep Practices for Newborns  Salena Saner, RN 05/08/2023 11:50

## 2023-05-23 ENCOUNTER — Other Ambulatory Visit: Payer: Self-pay | Admitting: Obstetrics and Gynecology

## 2023-05-23 DIAGNOSIS — N6310 Unspecified lump in the right breast, unspecified quadrant: Secondary | ICD-10-CM

## 2023-06-03 ENCOUNTER — Ambulatory Visit
Admission: RE | Admit: 2023-06-03 | Discharge: 2023-06-03 | Disposition: A | Discharge status: Home / Self Care | Payer: BC Managed Care – PPO | Source: Ambulatory Visit | Attending: Obstetrics and Gynecology | Admitting: Obstetrics and Gynecology

## 2023-06-03 DIAGNOSIS — N6310 Unspecified lump in the right breast, unspecified quadrant: Secondary | ICD-10-CM

## 2023-06-04 ENCOUNTER — Other Ambulatory Visit: Payer: Self-pay | Admitting: Obstetrics and Gynecology

## 2023-06-04 DIAGNOSIS — N6001 Solitary cyst of right breast: Secondary | ICD-10-CM

## 2023-12-02 ENCOUNTER — Inpatient Hospital Stay: Admission: RE | Admit: 2023-12-02 | Payer: Self-pay | Source: Ambulatory Visit

## 2023-12-18 ENCOUNTER — Other Ambulatory Visit: Payer: Self-pay | Admitting: Obstetrics and Gynecology

## 2023-12-18 ENCOUNTER — Ambulatory Visit
Admission: RE | Admit: 2023-12-18 | Discharge: 2023-12-18 | Disposition: A | Discharge status: Home / Self Care | Source: Ambulatory Visit | Attending: Obstetrics and Gynecology | Admitting: Obstetrics and Gynecology

## 2023-12-18 DIAGNOSIS — N6001 Solitary cyst of right breast: Secondary | ICD-10-CM

## 2023-12-18 DIAGNOSIS — N6310 Unspecified lump in the right breast, unspecified quadrant: Secondary | ICD-10-CM

## 2023-12-23 ENCOUNTER — Ambulatory Visit
Admission: RE | Admit: 2023-12-23 | Discharge: 2023-12-23 | Disposition: A | Discharge status: Home / Self Care | Source: Ambulatory Visit | Attending: Obstetrics and Gynecology | Admitting: Obstetrics and Gynecology

## 2023-12-23 DIAGNOSIS — N6310 Unspecified lump in the right breast, unspecified quadrant: Secondary | ICD-10-CM

## 2023-12-23 DIAGNOSIS — N6001 Solitary cyst of right breast: Secondary | ICD-10-CM
